# Patient Record
Sex: Male | Born: 1985 | Hispanic: Yes | Marital: Married | Smoking: Never smoker
Health system: Southern US, Community
[De-identification: ages and names within clinical notes are randomized; demographics above are authoritative.]

## PROBLEM LIST (undated history)

## (undated) ENCOUNTER — Emergency Department: Admission: EM | Payer: Self-pay | Source: Home / Self Care

## (undated) DIAGNOSIS — F329 Major depressive disorder, single episode, unspecified: Secondary | ICD-10-CM

## (undated) DIAGNOSIS — I10 Essential (primary) hypertension: Secondary | ICD-10-CM

## (undated) DIAGNOSIS — F419 Anxiety disorder, unspecified: Secondary | ICD-10-CM

## (undated) DIAGNOSIS — F32A Depression, unspecified: Secondary | ICD-10-CM

---

## 2016-10-26 ENCOUNTER — Emergency Department
Admission: EM | Admit: 2016-10-26 | Discharge: 2016-10-26 | Disposition: A | Discharge status: Home / Self Care | Payer: Self-pay | Attending: Emergency Medicine | Admitting: Emergency Medicine

## 2016-10-26 ENCOUNTER — Encounter: Payer: Self-pay | Admitting: Emergency Medicine

## 2016-10-26 DIAGNOSIS — R45851 Suicidal ideations: Secondary | ICD-10-CM

## 2016-10-26 DIAGNOSIS — Z5181 Encounter for therapeutic drug level monitoring: Secondary | ICD-10-CM | POA: Insufficient documentation

## 2016-10-26 DIAGNOSIS — F32A Depression, unspecified: Secondary | ICD-10-CM

## 2016-10-26 DIAGNOSIS — I10 Essential (primary) hypertension: Secondary | ICD-10-CM | POA: Insufficient documentation

## 2016-10-26 DIAGNOSIS — F4325 Adjustment disorder with mixed disturbance of emotions and conduct: Secondary | ICD-10-CM

## 2016-10-26 DIAGNOSIS — F329 Major depressive disorder, single episode, unspecified: Secondary | ICD-10-CM | POA: Insufficient documentation

## 2016-10-26 HISTORY — DX: Major depressive disorder, single episode, unspecified: F32.9

## 2016-10-26 HISTORY — DX: Depression, unspecified: F32.A

## 2016-10-26 HISTORY — DX: Essential (primary) hypertension: I10

## 2016-10-26 LAB — COMPREHENSIVE METABOLIC PANEL
ALT: 60 U/L (ref 17–63)
AST: 36 U/L (ref 15–41)
Albumin: 4.7 g/dL (ref 3.5–5.0)
Alkaline Phosphatase: 69 U/L (ref 38–126)
Anion gap: 9 (ref 5–15)
BUN: 17 mg/dL (ref 6–20)
CHLORIDE: 101 mmol/L (ref 101–111)
CO2: 29 mmol/L (ref 22–32)
Calcium: 9.3 mg/dL (ref 8.9–10.3)
Creatinine, Ser: 1.15 mg/dL (ref 0.61–1.24)
Glucose, Bld: 106 mg/dL — ABNORMAL HIGH (ref 65–99)
POTASSIUM: 3.8 mmol/L (ref 3.5–5.1)
Sodium: 139 mmol/L (ref 135–145)
Total Bilirubin: 0.6 mg/dL (ref 0.3–1.2)
Total Protein: 8.1 g/dL (ref 6.5–8.1)

## 2016-10-26 LAB — URINE DRUG SCREEN, QUALITATIVE (ARMC ONLY)
AMPHETAMINES, UR SCREEN: NOT DETECTED
Barbiturates, Ur Screen: NOT DETECTED
Benzodiazepine, Ur Scrn: POSITIVE — AB
CANNABINOID 50 NG, UR ~~LOC~~: NOT DETECTED
Cocaine Metabolite,Ur ~~LOC~~: NOT DETECTED
MDMA (ECSTASY) UR SCREEN: NOT DETECTED
METHADONE SCREEN, URINE: NOT DETECTED
Opiate, Ur Screen: NOT DETECTED
Phencyclidine (PCP) Ur S: NOT DETECTED
TRICYCLIC, UR SCREEN: NOT DETECTED

## 2016-10-26 LAB — CBC
HCT: 45.3 % (ref 40.0–52.0)
HEMOGLOBIN: 15.4 g/dL (ref 13.0–18.0)
MCH: 28 pg (ref 26.0–34.0)
MCHC: 33.9 g/dL (ref 32.0–36.0)
MCV: 82.5 fL (ref 80.0–100.0)
PLATELETS: 266 10*3/uL (ref 150–440)
RBC: 5.49 MIL/uL (ref 4.40–5.90)
RDW: 13.2 % (ref 11.5–14.5)
WBC: 9.5 10*3/uL (ref 3.8–10.6)

## 2016-10-26 LAB — ACETAMINOPHEN LEVEL

## 2016-10-26 LAB — ETHANOL

## 2016-10-26 LAB — SALICYLATE LEVEL

## 2016-10-26 MED ORDER — BUPRENORPHINE HCL 8 MG SL SUBL
8.0000 mg | SUBLINGUAL_TABLET | Freq: Every day | SUBLINGUAL | Status: DC
  Administered 2016-10-26: 8 mg via SUBLINGUAL
  Filled 2016-10-26: qty 1

## 2016-10-26 NOTE — ED Notes (Signed)
Patient taking shower.

## 2016-10-26 NOTE — ED Notes (Signed)
Breakfast tray brought to patient

## 2016-10-26 NOTE — ED Triage Notes (Addendum)
Pt ambulatory to triage with steady gait. Pt brought in by sister. Pt sts he has HX of depression, currently taking medication. Recent events, unspecified by pt, has pt having depression and SI thoughts without plan. Pt denies ETOH and drug usage. Pt is cooperative and calm in triage with understanding of process.

## 2016-10-26 NOTE — ED Provider Notes (Signed)
[facility] Emergency Department Provider Note   First MD Initiated Contact with Patient 10/26/16 0133     (approximate)  I have reviewed the triage vital signs and the nursing notes.   HISTORY  Chief Complaint Suicidal   HPI Denny PeonJairo Manalo is a 31 y.o. male with history of depression and previous suicide attempt via "cutting"presents emergency Department with feelings of depression and suicidal ideation 4 days. Patient states that he left his family 3 days ago   Past Medical History:  Diagnosis Date  . Depression   . Hypertension     There are no active problems to display for this patient.   History reviewed. No pertinent surgical history.  Prior to Admission medications   Medication Sig Start Date End Date Taking? Authorizing Provider  buprenorphine (SUBUTEX) 8 MG SUBL SL tablet Place 8 mg under the tongue daily.   Yes Historical Provider, MD    Allergies Patient has no known allergies.  History reviewed. No pertinent family history.  Social History Social History  Substance Use Topics  . Smoking status: Never Smoker  . Smokeless tobacco: Never Used  . Alcohol use Yes    Review of Systems Constitutional: No fever/chills Eyes: No visual changes. ENT: No sore throat. Cardiovascular: Denies chest pain. Respiratory: Denies shortness of breath. Gastrointestinal: No abdominal pain.  No nausea, no vomiting.  No diarrhea.  No constipation. Genitourinary: Negative for dysuria. Musculoskeletal: Negative for back pain. Skin: Negative for rash. Neurological: Negative for headaches, focal weakness or numbness. Psychiatric:Positive for depression and suicidal ideation.  10-point ROS otherwise negative.  ____________________________________________   PHYSICAL EXAM:  VITAL SIGNS: ED Triage Vitals  Enc Vitals Group     BP 10/26/16 0102 (!) 172/106     Pulse Rate 10/26/16 0102 (!) 106     Resp --      Temp 10/26/16 0102 98.2 F  (36.8 C)     Temp Source 10/26/16 0102 Oral     SpO2 10/26/16 0102 99 %     Weight 10/26/16 0102 240 lb (108.9 kg)     Height 10/26/16 0102 5\' 9"  (1.753 m)     Head Circumference --      Peak Flow --      Pain Score 10/26/16 0132 0     Pain Loc --      Pain Edu? --      Excl. in GC? --     Constitutional: Alert and oriented. Well appearing and in no acute distress. Eyes: Conjunctivae are normal. PERRL. EOMI. Head: Atraumatic. Mouth/Throat: Mucous membranes are moist Neck: No stridor.  Cardiovascular: Normal rate, regular rhythm. Good peripheral circulation. Grossly normal heart sounds. Respiratory: Normal respiratory effort.  No retractions. Lungs CTAB. Gastrointestinal: Soft and nontender. No distention.  Musculoskeletal: No lower extremity tenderness nor edema. No gross deformities of extremities. Neurologic:  Normal speech and language. No gross focal neurologic deficits are appreciated.  Skin:  Skin is warm, dry and intact. No rash noted. Psychiatric: Depressed mood.Marland Kitchen. Speech and behavior are normal.  ____________________________________________   LABS (all labs ordered are listed, but only abnormal results are displayed)  Labs Reviewed  COMPREHENSIVE METABOLIC PANEL - Abnormal; Notable for the following:       Result Value   Glucose, Bld 106 (*)    All other components within normal limits  ACETAMINOPHEN LEVEL - Abnormal; Notable for the following:    Acetaminophen (Tylenol), Serum <10 (*)    All other components within normal limits  URINE DRUG SCREEN, QUALITATIVE ([facility] ONLY) - Abnormal; Notable for the following:    Benzodiazepine, Ur Scrn POSITIVE (*)    All other components within normal limits  ETHANOL  SALICYLATE LEVEL  CBC      Procedures     INITIAL IMPRESSION / ASSESSMENT AND PLAN / ED COURSE  Pertinent labs & imaging results that were available during my care of the patient were reviewed by me and considered in my medical decision making (see  chart for details).  Patient was suicidal ideation and depression awaiting psychiatry evaluation.      ____________________________________________  FINAL CLINICAL IMPRESSION(S) / ED DIAGNOSES  Final diagnoses:  Suicidal ideation  Depression, unspecified depression type     MEDICATIONS GIVEN DURING THIS VISIT:  Medications - No data to display   NEW OUTPATIENT MEDICATIONS STARTED DURING THIS VISIT:  New Prescriptions   No medications on file    Modified Medications   No medications on file    Discontinued Medications   No medications on file     Note:  This document was prepared using Dragon voice recognition software and may include unintentional dictation errors.    Darci Current, MD 10/26/16 [phone]

## 2016-10-26 NOTE — Consult Note (Signed)
[facility] Consult   Reason for Consult:  Consult for this 31 year old man who came into the emergency room with recent increase in anxiety and depression Referring Physician:  Quale Patient Identification: Kurt Collier MRN:  [id number] Principal Diagnosis: Adjustment disorder with mixed disturbance of emotions and conduct Diagnosis:   Patient Active Problem List   Diagnosis Date Noted  . Adjustment disorder with mixed disturbance of emotions and conduct [F43.25] 10/26/2016    Total Time spent with patient: 1 hour  Subjective:   Kurt Collier is a 31 y.o. male patient admitted with "my depression was bad" patient interviewed chart reviewed. 31 year old man who reports that he's been under a lot of stress at work. He works at [employer] and says that he's been feeling overwhelmed by stress and working long hours. He feels like a few days ago he had a sort of breakdown. He disappeared for 3 days when off driving in his car hiding from his family keeping his phone turned off. Finally checked his phone saw that they had been searching for him and came back to his family. He is a little vague about his symptoms but tells me that he feels depressed and stressed out. Sleep patterns are erratic. Denies having any suicidal or homicidal thoughts at all. Denies any psychotic symptoms. He says that he has an alcohol problem and had been drinking some recently but back on it. He says he has an opiate problem and used to take Subutex but has not used any drugs recently. Not currently on any psychiatric medicine.  Social history: Lives with his wife and 3 children. Works as a Freight forwarder at [employer]. Her Graff medical history: No identified medical problems outside of the mental health.  Substance abuse history: He says he has a chronic alcohol and opiate problem. Used to be on Subutex. Has not gotten back into abusing opiates recently. No history of DTs or seizures.Marland Kitchen  HPI:  One previous  hospitalization at [facility] because of suicidal ideation and an overdose some years ago. Has been treated with medicine in the past including antidepressants but is vague about whether they ever helped. He thought they wishes made him feel tired. No history of violence no history of psychosis. Unclear diagnosis  Past Psychiatric History: See note directly above under history of present illness. One previous hospitalization in one previous suicide attempt not currently receiving any treatment  Risk to Self: Suicidal Ideation: Yes-Currently Present Suicidal Intent: No Is patient at risk for suicide?: No Suicidal Plan?: No Access to Means: Yes Specify Access to Suicidal Means: Pt has access to drugs What has been your use of drugs/alcohol within the last 12 months?: beer, 3 16 oz, once a week How many times?: 0 Other Self Harm Risks: none identified Triggers for Past Attempts: None known Intentional Self Injurious Behavior: None Risk to Others: Homicidal Ideation: No Thoughts of Harm to Others: No Current Homicidal Intent: No Current Homicidal Plan: No Access to Homicidal Means: No Identified Victim: None identified History of harm to others?: No Assessment of Violence: None Noted Violent Behavior Description: None identified Does patient have access to weapons?: No Criminal Charges Pending?: No Does patient have a court date: No Prior Inpatient Therapy: Prior Inpatient Therapy: Yes Prior Therapy Dates: 2011 Prior Therapy Facilty/Provider(s): [facility] Reason for Treatment: depression Prior Outpatient Therapy: Prior Outpatient Therapy: No Prior Therapy Dates: n/a Prior Therapy Facilty/Provider(s): n/a Reason for Treatment: n/a Does patient have an ACCT team?: No Does patient have  Intensive In-House Services?  : No Does patient have [facility] services? : No Does patient have P4CC services?: No  Past Medical History:  Past Medical History:  Diagnosis Date  . Depression   .  Hypertension    History reviewed. No pertinent surgical history. Family History: History reviewed. No pertinent family history. Family Psychiatric  History: Positive for a family history of depression and one uncle who had schizophrenia Social History:  History  Alcohol Use  . Yes     History  Drug use: Unknown    Social History   Social History  . Marital status: Married    Spouse name: N/A  . Number of children: N/A  . Years of education: N/A   Social History Main Topics  . Smoking status: Never Smoker  . Smokeless tobacco: Never Used  . Alcohol use Yes  . Drug use: Unknown  . Sexual activity: Not Asked   Other Topics Concern  . None   Social History Narrative  . None   Additional Social History:    Allergies:  No Known Allergies  Labs:  Results for orders placed or performed during the hospital encounter of 10/26/16 (from the past 48 hour(s))  Comprehensive metabolic panel     Status: Abnormal   Collection Time: 10/26/16  1:10 AM  Result Value Ref Range   Sodium 139 135 - 145 mmol/L   Potassium 3.8 3.5 - 5.1 mmol/L   Chloride 101 101 - 111 mmol/L   CO2 29 22 - 32 mmol/L   Glucose, Bld 106 (H) 65 - 99 mg/dL   BUN 17 6 - 20 mg/dL   Creatinine, Ser 1.15 0.61 - 1.24 mg/dL   Calcium 9.3 8.9 - 10.3 mg/dL   Total Protein 8.1 6.5 - 8.1 g/dL   Albumin 4.7 3.5 - 5.0 g/dL   AST 36 15 - 41 U/L   ALT 60 17 - 63 U/L   Alkaline Phosphatase 69 38 - 126 U/L   Total Bilirubin 0.6 0.3 - 1.2 mg/dL   GFR calc non Af Amer >60 >60 mL/min   GFR calc Af Amer >60 >60 mL/min    Comment: (NOTE) The eGFR has been calculated using the CKD EPI equation. This calculation has not been validated in all clinical situations. eGFR's persistently <60 mL/min signify possible Chronic Kidney Disease.    Anion gap 9 5 - 15  Ethanol     Status: None   Collection Time: 10/26/16  1:10 AM  Result Value Ref Range   Alcohol, Ethyl (B) <5 <5 mg/dL    Comment:        LOWEST DETECTABLE LIMIT  FOR SERUM ALCOHOL IS 5 mg/dL FOR MEDICAL PURPOSES ONLY   Salicylate level     Status: None   Collection Time: 10/26/16  1:10 AM  Result Value Ref Range   Salicylate Lvl <2.5 2.8 - 30.0 mg/dL  Acetaminophen level     Status: Abnormal   Collection Time: 10/26/16  1:10 AM  Result Value Ref Range   Acetaminophen (Tylenol), Serum <10 (L) 10 - 30 ug/mL    Comment:        THERAPEUTIC CONCENTRATIONS VARY SIGNIFICANTLY. A RANGE OF 10-30 ug/mL MAY BE AN EFFECTIVE CONCENTRATION FOR MANY PATIENTS. HOWEVER, SOME ARE BEST TREATED AT CONCENTRATIONS OUTSIDE THIS RANGE. ACETAMINOPHEN CONCENTRATIONS >150 ug/mL AT 4 HOURS AFTER INGESTION AND >50 ug/mL AT 12 HOURS AFTER INGESTION ARE OFTEN ASSOCIATED WITH TOXIC REACTIONS.   cbc     Status: None  Collection Time: 10/26/16  1:10 AM  Result Value Ref Range   WBC 9.5 3.8 - 10.6 K/uL   RBC 5.49 4.40 - 5.90 MIL/uL   Hemoglobin 15.4 13.0 - 18.0 g/dL   HCT 45.3 40.0 - 52.0 %   MCV 82.5 80.0 - 100.0 fL   MCH 28.0 26.0 - 34.0 pg   MCHC 33.9 32.0 - 36.0 g/dL   RDW 13.2 11.5 - 14.5 %   Platelets 266 150 - 440 K/uL  Urine Drug Screen, Qualitative     Status: Abnormal   Collection Time: 10/26/16  1:10 AM  Result Value Ref Range   Tricyclic, Ur Screen NONE DETECTED NONE DETECTED   Amphetamines, Ur Screen NONE DETECTED NONE DETECTED   MDMA (Ecstasy)Ur Screen NONE DETECTED NONE DETECTED   Cocaine Metabolite,Ur [place] NONE DETECTED NONE DETECTED   Opiate, Ur Screen NONE DETECTED NONE DETECTED   Phencyclidine (PCP) Ur S NONE DETECTED NONE DETECTED   Cannabinoid 50 Ng, Ur [place] NONE DETECTED NONE DETECTED   Barbiturates, Ur Screen NONE DETECTED NONE DETECTED   Benzodiazepine, Ur Scrn POSITIVE (A) NONE DETECTED   Methadone Scn, Ur NONE DETECTED NONE DETECTED    Comment: (NOTE) 174  Tricyclics, urine               Cutoff 1000 ng/mL 200  Amphetamines, urine             Cutoff 1000 ng/mL 300  MDMA (Ecstasy), urine           Cutoff 500 ng/mL 400  Cocaine  Metabolite, urine       Cutoff 300 ng/mL 500  Opiate, urine                   Cutoff 300 ng/mL 600  Phencyclidine (PCP), urine      Cutoff 25 ng/mL 700  Cannabinoid, urine              Cutoff 50 ng/mL 800  Barbiturates, urine             Cutoff 200 ng/mL 900  Benzodiazepine, urine           Cutoff 200 ng/mL 1000 Methadone, urine                Cutoff 300 ng/mL 1100 1200 The urine drug screen provides only a preliminary, unconfirmed 1300 analytical test result and should not be used for non-medical 1400 purposes. Clinical consideration and professional judgment should 1500 be applied to any positive drug screen result due to possible 1600 interfering substances. A more specific alternate chemical method 1700 must be used in order to obtain a confirmed analytical result.  1800 Gas chromato graphy / mass spectrometry (GC/MS) is the preferred 1900 confirmatory method.     Current Facility-Administered Medications  Medication Dose Route Frequency Provider Last Rate Last Dose  . buprenorphine (SUBUTEX) sublingual tablet 8 mg  8 mg Sublingual Daily Orbie Pyo, MD   8 mg at 10/26/16 1338   Current Outpatient Prescriptions  Medication Sig Dispense Refill  . buprenorphine (SUBUTEX) 8 MG SUBL SL tablet Place 8 mg under the tongue daily.      Musculoskeletal: Strength & Muscle Tone: within normal limits Gait & Station: normal Patient leans: N/A  Psychiatric Specialty Exam: Physical Exam  Nursing note and vitals reviewed. Constitutional: He appears well-developed and well-nourished.  HENT:  Head: Normocephalic and atraumatic.  Eyes: Conjunctivae are normal. Pupils are equal, round, and reactive to light.  Neck: Normal range  of motion.  Cardiovascular: Regular rhythm and normal heart sounds.   Respiratory: He is in respiratory distress.  GI: Soft.  Musculoskeletal: Normal range of motion.  Neurological: He is alert.  Skin: Skin is warm and dry.  Psychiatric: He has a  normal mood and affect. His speech is normal and behavior is normal. Judgment and thought content normal. He is not agitated. Cognition and memory are normal.    ROS  Blood pressure (!) 148/92, pulse 67, temperature 98.1 F (36.7 C), temperature source Oral, resp. rate 18, height _0  (1.753 m), weight 108.9 kg (240 lb), SpO2 100 %.Body mass index is 35.44 kg/m.  General Appearance: Casual  Eye Contact:  Good  Speech:  Clear and Coherent  Volume:  Normal  Mood:  Euthymic  Affect:  Constricted  Thought Process:  Goal Directed  Orientation:  Full (Time, Place, and Person)  Thought Content:  Logical  Suicidal Thoughts:  No  Homicidal Thoughts:  No  Memory:  Immediate;   Good Recent;   Fair Remote;   Fair  Judgement:  Fair  Insight:  Fair  Psychomotor Activity:  Decreased  Concentration:  Concentration: Fair  Recall:  [employer] of Knowledge:  Fair  Language:  Fair  Akathisia:  No  Handed:  Right  AIMS (if indicated):     Assets:  Communication Skills Desire for Improvement Financial Resources/Insurance Housing Social Support  ADL's:  Intact  Cognition:  WNL  Sleep:        Treatment Plan Summary: Plan 31 year old man recently has had a bit of an anxiety breakdown. No sign of psychosis. Denies any suicidal or homicidal thoughts. Lucid and appropriate currently. Does not meet commitment criteria. Does not require further inpatient treatment. Patient counseled to follow-up with [facility] locally. No prescriptions written. Case reviewed with emergency room doctor and TTS.  Disposition: Patient does not meet criteria for psychiatric inpatient admission.  Alethia Berthold, MD 10/26/2016 4:27 PM

## 2016-10-26 NOTE — ED Notes (Signed)
PT  VOL  SEEN  BY  DR  CLAPACS  PENDING  D/C 

## 2016-10-26 NOTE — BH Assessment (Signed)
Assessment Note  Kurt Collier is an 31 y.o. male, with a history of depression, presenting to the ED, via his sister, with concerns of worsening depression and suicidal ideations with no plan or intent.  Pt reports his symptoms of depression have been gradually getting worse.  He states that ongoing conflict at work and being unfaithful has made his depression worse.  He states he felt guilty for cheating on his wife, even though she forgave him.  He states that the culmination of the infidelity and work issues caused him to leave his family.  He states that he just started driving without any specific place in mind.  He says he turned his phone off because he did not want to have contact with anyone.  Pt reports sleeping in his car in the woods.  He states that when he turned his phone on he realized he had missed several calls from his family.  Pt was reportedly missing for three days which led to his family filing a missing person's report on him.  Pt denies SI/HI while in the ED.  He denies auditory.visual hallucinations. He denies current drug use.  He does admit to drinking an occasional beer during the week.  Diagnosis: Depression  Past Medical History:  Past Medical History:  Diagnosis Date  . Depression   . Hypertension     History reviewed. No pertinent surgical history.  Family History: History reviewed. No pertinent family history.  Social History:  reports that he has never smoked. He has never used smokeless tobacco. He reports that he drinks alcohol. His drug history is not on file.  Additional Social History:  Alcohol / Drug Use Pain Medications: See PTA Prescriptions: See PTA Over the Counter: See PTA History of alcohol / drug use?: Yes Longest period of sobriety (when/how long): 4 years Negative Consequences of Use: Personal relationships, Work / School Substance #1 Name of Substance 1: Heroin 1 - Age of First Use: 26 1 - Amount (size/oz): gram 1 - Frequency:  daily 1 - Duration: varies 1 - Last Use / Amount: 2014 Substance #2 Name of Substance 2: beer 2 - Age of First Use: 30 2 - Amount (size/oz): 2 16 oz bottles 2 - Frequency: once a week 2 - Duration: varies 2 - Last Use / Amount: 3 days ago  CIWA: CIWA-Ar BP: (!) 135/96 Pulse Rate: 86 COWS:    Allergies: No Known Allergies  Home Medications:  (Not in a hospital admission)  OB/GYN Status:  No LMP for male patient.  General Assessment Data Location of Assessment: [facility] ED TTS Assessment: In system Is this a Tele or Face-to-Face Assessment?: Face-to-Face Is this an Initial Assessment or a Re-assessment for this encounter?: Initial Assessment Marital status: Married [place] name: n/a Is patient pregnant?: No Pregnancy Status: No Living Arrangements: Other relatives Can pt return to current living arrangement?: Yes Admission Status: Voluntary Is patient capable of signing voluntary admission?: Yes Referral Source: Self/Family/Friend Insurance type: None     Crisis Care Plan Living Arrangements: Other relatives Legal Guardian: Other: (self) Name of Psychiatrist: none reported Name of Therapist: none reported  Education Status Is patient currently in school?: No Current Grade: n/a Highest grade of school patient has completed: 12 Name of school: n/a Contact person: n/a  Risk to self with the past 6 months Suicidal Ideation: Yes-Currently Present Has patient been a risk to self within the past 6 months prior to admission? : No Suicidal Intent: No Has patient had any suicidal  intent within the past 6 months prior to admission? : No Is patient at risk for suicide?: No Suicidal Plan?: No Has patient had any suicidal plan within the past 6 months prior to admission? : No Access to Means: Yes Specify Access to Suicidal Means: Pt has access to drugs What has been your use of drugs/alcohol within the last 12 months?: beer, 3 16 oz, once a week Previous Attempts/Gestures:  No How many times?: 0 Other Self Harm Risks: none identified Triggers for Past Attempts: None known Intentional Self Injurious Behavior: None Family Suicide History: No Recent stressful life event(s): Conflict (Comment), Turmoil (Comment) (Pt reports feeling guilty becaus ehe cheated on his wife) Persecutory voices/beliefs?: Yes Depression: Yes Depression Symptoms: Insomnia, Isolating, Guilt, Loss of interest in usual pleasures, Feeling worthless/self pity Substance abuse history and/or treatment for substance abuse?: Yes Suicide prevention information given to non-admitted patients: Not applicable  Risk to Others within the past 6 months Homicidal Ideation: No Does patient have any lifetime risk of violence toward others beyond the six months prior to admission? : No Thoughts of Harm to Others: No Current Homicidal Intent: No Current Homicidal Plan: No Access to Homicidal Means: No Identified Victim: None identified History of harm to others?: No Assessment of Violence: None Noted Violent Behavior Description: None identified Does patient have access to weapons?: No Criminal Charges Pending?: No Does patient have a court date: No Is patient on probation?: No  Psychosis Hallucinations: None noted Delusions: None noted  Mental Status Report Appearance/Hygiene: In scrubs Eye Contact: Good Motor Activity: Freedom of movement Speech: Logical/coherent Level of Consciousness: Alert Mood: Depressed, Guilty, Sad Affect: Appropriate to circumstance, Depressed, Sad Anxiety Level: Minimal Thought Processes: Coherent, Relevant Judgement: Partial Orientation: Person, Place, Time, Situation Obsessive Compulsive Thoughts/Behaviors: None  Cognitive Functioning Concentration: Good Memory: Recent Intact, Remote Intact IQ: Average Insight: Poor Impulse Control: Poor Appetite: Fair Weight Loss: 0 Weight Gain: 0 Sleep: No Change Vegetative Symptoms: None  ADLScreening [facility]  Assessment Services) Patient's cognitive ability adequate to safely complete daily activities?: Yes Patient able to express need for assistance with ADLs?: Yes Independently performs ADLs?: Yes (appropriate for developmental age)  Prior Inpatient Therapy Prior Inpatient Therapy: Yes Prior Therapy Dates: 2011 Prior Therapy Facilty/Provider(s): [facility] Reason for Treatment: depression  Prior Outpatient Therapy Prior Outpatient Therapy: No Prior Therapy Dates: n/a Prior Therapy Facilty/Provider(s): n/a Reason for Treatment: n/a Does patient have an ACCT team?: No Does patient have Intensive In-House Services?  : No Does patient have [facility] services? : No Does patient have P4CC services?: No  ADL Screening (condition at time of admission) Patient's cognitive ability adequate to safely complete daily activities?: Yes Patient able to express need for assistance with ADLs?: Yes Independently performs ADLs?: Yes (appropriate for developmental age)       Abuse/Neglect Assessment (Assessment to be complete while patient is alone) Physical Abuse: Denies Verbal Abuse: Denies Sexual Abuse: Denies Exploitation of patient/patient's resources: Denies Self-Neglect: Denies Values / Beliefs Cultural Requests During Hospitalization: None Spiritual Requests During Hospitalization: None Consults Spiritual Care Consult Needed: No Social Work Consult Needed: No Merchant navy officerAdvance Directives (For Healthcare) Does Patient Have a Medical Advance Directive?: No Would patient like information on creating a medical advance directive?: No - Patient declined    Additional Information 1:1 In Past 12 Months?: No CIRT Risk: No Elopement Risk: No Does patient have medical clearance?: Yes     Disposition:  Disposition Initial Assessment Completed for this Encounter: Yes Disposition of Patient: Other dispositions Other  disposition(s): Other (Comment) (Pending Psych MD consult)  On Site Evaluation  by:   Reviewed with Physician:    Kurt Collier Kurt Collier 10/26/2016 3:15 AM

## 2016-10-26 NOTE — Discharge Instructions (Signed)
You have been seen in the Emergency Department (ED)  today for psychiatric issues.  You have been evaluated by the behavioral medicine specialists and are being referred to: ° °[facility] °[address] °[place], [place] [address] °Phone:  [phone] or [phone] ° °Open Access:   °Walk-in ASSESSMENT hours, M-W-F, 8:00am - 3:00pm °Advanced Acess CRISIS:  M-F, 8:00am - 8:00pm °Outpatient Services Office Hours:  M-F, 8:00am - 5:00pm ° °Please return to the Emergency Department (ED)  immediately if you have ANY thoughts of hurting yourself or anyone else, so that we may help you. ° °Please avoid alcohol and drug use. ° °Follow up with your doctor and/or therapist as soon as possible regarding today's ED  visit.   Please follow up any other recommendations and clinic appointments provided by the psychiatry team that saw you in the Emergency Department. ° °

## 2016-10-26 NOTE — ED Provider Notes (Signed)
Patient seen, advised to discharge patient with outpatient follow-up by Dr. Toni Amendlapacs of psychiatry  Discharging patient home, return precautions provided, and recommendations for follow-up with [facility].   Sharyn CreamerMark Quale, MD 10/26/16 [phone]

## 2017-02-13 ENCOUNTER — Encounter: Payer: Self-pay | Admitting: Emergency Medicine

## 2017-02-13 ENCOUNTER — Emergency Department
Admission: EM | Admit: 2017-02-13 | Discharge: 2017-02-13 | Disposition: A | Discharge status: Other Acute Inpatient Hospital | Payer: Self-pay | Attending: Emergency Medicine | Admitting: Emergency Medicine

## 2017-02-13 ENCOUNTER — Inpatient Hospital Stay
Admission: AD | Admit: 2017-02-13 | Discharge: 2017-02-14 | DRG: 882 | Disposition: A | Discharge status: Home / Self Care | Payer: Self-pay | Source: Intra-hospital | Attending: Psychiatry | Admitting: Psychiatry

## 2017-02-13 DIAGNOSIS — F329 Major depressive disorder, single episode, unspecified: Secondary | ICD-10-CM | POA: Insufficient documentation

## 2017-02-13 DIAGNOSIS — F4323 Adjustment disorder with mixed anxiety and depressed mood: Principal | ICD-10-CM | POA: Diagnosis present

## 2017-02-13 DIAGNOSIS — Z630 Problems in relationship with spouse or partner: Secondary | ICD-10-CM

## 2017-02-13 DIAGNOSIS — F1111 Opioid abuse, in remission: Secondary | ICD-10-CM | POA: Diagnosis present

## 2017-02-13 DIAGNOSIS — Z915 Personal history of self-harm: Secondary | ICD-10-CM

## 2017-02-13 DIAGNOSIS — R45851 Suicidal ideations: Secondary | ICD-10-CM

## 2017-02-13 DIAGNOSIS — F32A Depression, unspecified: Secondary | ICD-10-CM

## 2017-02-13 DIAGNOSIS — F4325 Adjustment disorder with mixed disturbance of emotions and conduct: Secondary | ICD-10-CM

## 2017-02-13 DIAGNOSIS — F1121 Opioid dependence, in remission: Secondary | ICD-10-CM | POA: Diagnosis present

## 2017-02-13 HISTORY — DX: Anxiety disorder, unspecified: F41.9

## 2017-02-13 LAB — CBC
HEMATOCRIT: 44.6 % (ref 40.0–52.0)
Hemoglobin: 14.6 g/dL (ref 13.0–18.0)
MCH: 26.6 pg (ref 26.0–34.0)
MCHC: 32.7 g/dL (ref 32.0–36.0)
MCV: 81.4 fL (ref 80.0–100.0)
PLATELETS: 285 10*3/uL (ref 150–440)
RBC: 5.48 MIL/uL (ref 4.40–5.90)
RDW: 13.5 % (ref 11.5–14.5)
WBC: 8.8 10*3/uL (ref 3.8–10.6)

## 2017-02-13 LAB — COMPREHENSIVE METABOLIC PANEL
ALBUMIN: 5 g/dL (ref 3.5–5.0)
ALK PHOS: 64 U/L (ref 38–126)
ALT: 56 U/L (ref 17–63)
AST: 38 U/L (ref 15–41)
Anion gap: 7 (ref 5–15)
BILIRUBIN TOTAL: 0.9 mg/dL (ref 0.3–1.2)
BUN: 18 mg/dL (ref 6–20)
CALCIUM: 9.5 mg/dL (ref 8.9–10.3)
CO2: 27 mmol/L (ref 22–32)
CREATININE: 0.97 mg/dL (ref 0.61–1.24)
Chloride: 105 mmol/L (ref 101–111)
GFR calc Af Amer: >60 mL/min (ref >60)
GLUCOSE: 93 mg/dL (ref 65–99)
Potassium: 4.3 mmol/L (ref 3.5–5.1)
Sodium: 139 mmol/L (ref 135–145)
TOTAL PROTEIN: 8.5 g/dL — AB (ref 6.5–8.1)

## 2017-02-13 LAB — SALICYLATE LEVEL: Salicylate Lvl: <7 mg/dL (ref 2.8–30.0)

## 2017-02-13 LAB — ACETAMINOPHEN LEVEL: Acetaminophen (Tylenol), Serum: <10 ug/mL — ABNORMAL LOW (ref 10–30)

## 2017-02-13 LAB — ETHANOL

## 2017-02-13 MED ORDER — BUPRENORPHINE HCL-NALOXONE HCL 8-2 MG SL SUBL
1.0000 | SUBLINGUAL_TABLET | Freq: Every day | SUBLINGUAL | Status: DC
  Administered 2017-02-14: 1 via SUBLINGUAL
  Filled 2017-02-13: qty 1

## 2017-02-13 MED ORDER — TRAZODONE HCL 100 MG PO TABS: 100.0000 mg | ORAL_TABLET | Freq: Every evening | ORAL | Status: DC | PRN

## 2017-02-13 MED ORDER — BUPRENORPHINE HCL-NALOXONE HCL 8-2 MG SL SUBL
1.0000 | SUBLINGUAL_TABLET | Freq: Every day | SUBLINGUAL | Status: DC
  Administered 2017-02-13: 1 via SUBLINGUAL
  Filled 2017-02-13: qty 1

## 2017-02-13 MED ORDER — ALUM & MAG HYDROXIDE-SIMETH 200-200-20 MG/5ML PO SUSP: 30.0000 mL | ORAL | Status: DC | PRN

## 2017-02-13 MED ORDER — ACETAMINOPHEN 325 MG PO TABS: 650.0000 mg | ORAL_TABLET | Freq: Four times a day (QID) | ORAL | Status: DC | PRN

## 2017-02-13 MED ORDER — BUPRENORPHINE HCL 8 MG SL SUBL
SUBLINGUAL_TABLET | SUBLINGUAL | Status: AC
  Filled 2017-02-13: qty 1

## 2017-02-13 MED ORDER — HYDROXYZINE HCL 25 MG PO TABS: 25.0000 mg | ORAL_TABLET | Freq: Three times a day (TID) | ORAL | Status: DC | PRN

## 2017-02-13 MED ORDER — TRAZODONE HCL 100 MG PO TABS
100.0000 mg | ORAL_TABLET | Freq: Every evening | ORAL | Status: DC | PRN
Start: 2017-02-13 — End: 2017-02-14

## 2017-02-13 MED ORDER — MAGNESIUM HYDROXIDE 400 MG/5ML PO SUSP: 30.0000 mL | Freq: Every day | ORAL | Status: DC | PRN

## 2017-02-13 NOTE — BH Assessment (Signed)
Tele Assessment Note   Kurt Collier is an 31 y.o. male presenting to the ED with thoughts of suicide, intermittent thoughts of crashing his car. He was IVC'd by [facility] were he is receiving drug treatment services. The patient denied having a plan to this clinician but admits to ideation and intent.  Denies previous attempts or ideation in the past. The patient is separated from his wife and children, currently living with his sister.  Denies HI or A/V. Recently sober and in treatment. The patient is in a suboxone program. Denies prior inpatient treatment. Admits to family history of substance abuse on the maternal side. Presents with depressed mood, blunted affect, fair eye contact, fair judgment, poor insight, oriented x4. Remains at [facility] for outpatient treatment.   Dr. Toni Amend recommends inpatient treatment  Diagnosis: Adjustment disorder with mixed disturbance of emotions and conduct  Past Medical History:  Past Medical History:  Diagnosis Date  . Anxiety   . Depression     History reviewed. No pertinent surgical history.  Family History: History reviewed. No pertinent family history.  Social History:  reports that he has never smoked. He has never used smokeless tobacco. He reports that he drinks alcohol. His drug history is not on file.  Additional Social History:  Alcohol / Drug Use Pain Medications: see MAR Prescriptions: see MAR Over the Counter: see MAR History of alcohol / drug use?: Yes Substance #1 Name of Substance 1: Opiates 1 - Age of First Use: unknown 1 - Amount (size/oz): unknown  1 - Frequency: unknown  1 - Duration: unknown  1 - Last Use / Amount: On suboxone program, 8mg   CIWA: CIWA-Ar BP: (!) 155/99 Pulse Rate: 63 COWS:    PATIENT STRENGTHS: (choose at least two) Average or above average intelligence General fund of knowledge  Allergies: No Known Allergies  Home Medications:  (Not in a hospital admission)  OB/GYN Status:  No LMP for male  patient.  General Assessment Data Location of Assessment: [facility] ED TTS Assessment: In system Is this a Tele or Face-to-Face Assessment?: Face-to-Face Is this an Initial Assessment or a Re-assessment for this encounter?: Initial Assessment Marital status: Divorced [place] name: n/a Is patient pregnant?: No Pregnancy Status: No Living Arrangements: Other relatives (sister) Can pt return to current living arrangement?: Yes Admission Status: Involuntary Is patient capable of signing voluntary admission?: No Referral Source: Self/Family/Friend Insurance type: self pay  Medical Screening Exam [facility] Walk-in ONLY) Medical Exam completed: Yes  Crisis Care Plan Living Arrangements: Other relatives (sister) Name of Psychiatrist: [facility] Name of Therapist: [facility]  Education Status Is patient currently in school?: No Highest grade of school patient has completed: 2 yrs of college  Risk to self with the past 6 months Suicidal Ideation: Yes-Currently Present Has patient been a risk to self within the past 6 months prior to admission? : No Suicidal Intent: Yes-Currently Present Has patient had any suicidal intent within the past 6 months prior to admission? : No Is patient at risk for suicide?: Yes Suicidal Plan?: No Has patient had any suicidal plan within the past 6 months prior to admission? : No Access to Means: No What has been your use of drugs/alcohol within the last 12 months?: opiates Previous Attempts/Gestures: No How many times?: 0 Other Self Harm Risks: 0 Intentional Self Injurious Behavior: None Family Suicide History: Unknown Recent stressful life event(s): Divorce, Other (Comment) (sobriety) Persecutory voices/beliefs?: No Depression: Yes Depression Symptoms: Feeling worthless/self pity Substance abuse history and/or treatment for substance abuse?: Yes  Suicide prevention information given to non-admitted patients: Not applicable  Risk to Others within the past 6  months Homicidal Ideation: No Does patient have any lifetime risk of violence toward others beyond the six months prior to admission? : No Thoughts of Harm to Others: No Current Homicidal Intent: No Current Homicidal Plan: No Access to Homicidal Means: No History of harm to others?: No Assessment of Violence: None Noted Does patient have access to weapons?: No Criminal Charges Pending?: No Does patient have a court date: No Is patient on probation?: No  Psychosis Hallucinations: None noted Delusions: None noted  Mental Status Report Appearance/Hygiene: Unremarkable Eye Contact: Fair Motor Activity: Freedom of movement Speech: Other (Comment) (delayed) Level of Consciousness: Alert Mood: Depressed Affect: Blunted Anxiety Level: None Thought Processes: Coherent, Relevant Judgement: Impaired Orientation: Person, Place, Time, Situation Obsessive Compulsive Thoughts/Behaviors: None  Cognitive Functioning Concentration: Normal Memory: Recent Intact, Remote Intact IQ: Average Insight: Fair Impulse Control: Fair Appetite: Good Weight Loss: 0 Weight Gain: 0 Sleep: No Change Vegetative Symptoms: None  ADLScreening [facility] Assessment Services) Patient's cognitive ability adequate to safely complete daily activities?: Yes Patient able to express need for assistance with ADLs?: Yes Independently performs ADLs?: Yes (appropriate for developmental age)  Prior Inpatient Therapy Prior Inpatient Therapy: No  Prior Outpatient Therapy Prior Outpatient Therapy: Yes Prior Therapy Dates: ongoing Prior Therapy Facilty/Provider(s): [facility] Reason for Treatment: substance abuse Does patient have an ACCT team?: No Does patient have Intensive In-House Services?  : No Does patient have [facility] services? : No Does patient have P4CC services?: No  ADL Screening (condition at time of admission) Patient's cognitive ability adequate to safely complete daily activities?: Yes Is the patient deaf  or have difficulty hearing?: No Does the patient have difficulty seeing, even when wearing glasses/contacts?: No Does the patient have difficulty concentrating, remembering, or making decisions?: No Patient able to express need for assistance with ADLs?: Yes Does the patient have difficulty dressing or bathing?: No Independently performs ADLs?: Yes (appropriate for developmental age)             Merchant navy officerAdvance Directives (For Healthcare) Does Patient Have a Medical Advance Directive?: No    Additional Information 1:1 In Past 12 Months?: No CIRT Risk: No Elopement Risk: No Does patient have medical clearance?: Yes     Disposition:  Disposition Initial Assessment Completed for this Encounter: Yes Disposition of Patient: Inpatient treatment program Type of inpatient treatment program: Adult  Westley Hummershley H Kayle Correa 02/13/2017 9:31 PM

## 2017-02-13 NOTE — ED Notes (Signed)
[facility]

## 2017-02-13 NOTE — ED Provider Notes (Signed)
[facility] Emergency Department Provider Note  ____________________________________________   First MD Initiated Contact with Patient 02/13/17 1720     (approximate)  I have reviewed the triage vital signs and the nursing notes.   HISTORY  Chief Complaint Psychiatric Evaluation    HPI Kurt Collier is a 31 y.o. male with a history of anxiety and depression and prior substance abuse but he reports that today was his 3590 day clean anniversary [facility] for worsening depression and suicidal ideation.  His thoughts are occasionally that he should crash his car.  This seems to be the result of his wife recently asking for divorce.  He admits to worsening depression that waxes and wanes but is severe at times and does admit to suicidal thoughts.  Nothing in particular makes it better or worse.  He has some right lateral and posterior rib pain that started after playing basketball recently and states that he has not been active for a long time and just recently started playing again so he thinks he has a pulled muscle.  He remembers no specific trauma or injury to the area.  He denies chest pain, shortness of breath, nausea, vomiting, abdominal pain, fever/chills.   Past Medical History:  Diagnosis Date  . Anxiety   . Depression     Patient Active Problem List   Diagnosis Date Noted  . Adjustment disorder with mixed disturbance of emotions and conduct 10/26/2016    History reviewed. No pertinent surgical history.  Prior to Admission medications   Medication Sig Start Date End Date Taking? Authorizing Provider  buprenorphine (SUBUTEX) 8 MG SUBL SL tablet Place 8 mg under the tongue daily.    [provider]    Allergies Patient has no known allergies.  History reviewed. No pertinent family history.  Social History Social History  Substance Use Topics  . Smoking status: Never Smoker  . Smokeless  tobacco: Never Used  . Alcohol use Yes    Review of Systems Constitutional: No fever/chills Eyes: No visual changes. ENT: No sore throat. Cardiovascular: Denies chest pain. Respiratory: Denies shortness of breath. Gastrointestinal: No abdominal pain.  No nausea, no vomiting.  No diarrhea.  No constipation. Genitourinary: Negative for dysuria. Musculoskeletal: Negative for neck pain.  Negative for back pain.  Pain in his lateral/posterior right ribs Integumentary: Negative for rash. Neurological: Negative for headaches, focal weakness or numbness. Psych:  Worsening depression with intermittent suicidal ideation and plan to crash his car   ____________________________________________   PHYSICAL EXAM:  VITAL SIGNS: ED Triage Vitals  Enc Vitals Group     BP 02/13/17 1700 (!) 155/99     Pulse Rate 02/13/17 1700 63     Resp 02/13/17 1700 18     Temp 02/13/17 1700 98.3 F (36.8 C)     Temp Source 02/13/17 1700 Oral     SpO2 02/13/17 1700 100 %     Weight 02/13/17 1657 116.1 kg (256 lb)     Height 02/13/17 1657 1.753 m (5\' 9" )     Head Circumference --      Peak Flow --      Pain Score 02/13/17 1726 1     Pain Loc --      Pain Edu? --      Excl. in GC? --     Constitutional: Alert and oriented. Well appearing and in no acute distress. Eyes: Conjunctivae are normal.  Head: Atraumatic. Nose: No congestion/rhinnorhea. Mouth/Throat: Mucous  membranes are moist. Neck: No stridor.  No meningeal signs.   Cardiovascular: Normal rate, regular rhythm. Good peripheral circulation. Grossly normal heart sounds. Respiratory: Normal respiratory effort.  No retractions. Lungs CTAB. Gastrointestinal: Soft and nontender. No distention.  Musculoskeletal: No lower extremity tenderness nor edema. No gross deformities of extremities. I will tenderness to palpation of the right lateral posterior ribs consistent with musculoskeletal strain with no evidence of contusion and no point bony  tenderness Neurologic:  Normal speech and language. No gross focal neurologic deficits are appreciated.  Skin:  Skin is warm, dry and intact. No rash noted. Psychiatric: Mood and affect are flattened and depressed.  He admits to worsening depression and suicidal ideation.  ____________________________________________   LABS (all labs ordered are listed, but only abnormal results are displayed)  Labs Reviewed  COMPREHENSIVE METABOLIC PANEL - Abnormal; Notable for the following:       Result Value   Total Protein 8.5 (*)    All other components within normal limits  ACETAMINOPHEN LEVEL - Abnormal; Notable for the following:    Acetaminophen (Tylenol), Serum <10 (*)    All other components within normal limits  ETHANOL  SALICYLATE LEVEL  CBC  URINE DRUG SCREEN, QUALITATIVE ([facility] ONLY)   ____________________________________________  EKG  None - EKG not ordered by ED physician ____________________________________________  RADIOLOGY   No results found.  ____________________________________________   PROCEDURES  Critical Care performed: No   Procedure(s) performed:   Procedures   ____________________________________________   INITIAL IMPRESSION / ASSESSMENT AND PLAN / ED COURSE  Pertinent labs & imaging results that were available during my care of the patient were reviewed by me and considered in my medical decision making (see chart for details).  The patient has no acute medical problems at this time.  He has only very mild tenderness in the right lateral and posterior ribs that is most consistent with a musculoskeletal strain rather than a rib contusion or fracture.  No indication for radiographs at this time.  The patient was placed under involuntary commitment by [facility] and they have already spoken with Dr. Toni Amend.  Dr. Toni Amend spoke with me in person and plans to admit the patient.       ____________________________________________  FINAL CLINICAL  IMPRESSION(S) / ED DIAGNOSES  Final diagnoses:  Depression, unspecified depression type  Suicidal ideation     MEDICATIONS GIVEN DURING THIS VISIT:  Medications  buprenorphine-naloxone (SUBOXONE) 8-2 mg per SL tablet 1 tablet (not administered)  traZODone (DESYREL) tablet 100 mg (not administered)     NEW OUTPATIENT MEDICATIONS STARTED DURING THIS VISIT:  New Prescriptions   No medications on file    Modified Medications   No medications on file    Discontinued Medications   No medications on file     Note:  This document was prepared using Dragon voice recognition software and may include unintentional dictation errors.    Loleta Rose, MD 02/13/17 [phone]

## 2017-02-13 NOTE — BH Assessment (Signed)
Per Dr.Clapacs pt meets criteria for inpatient admission and [facility] pending medical [facility].

## 2017-02-13 NOTE — ED Triage Notes (Signed)
Pt also now c/o right rib pain. Ambulatory without difficulty. Reports played basketball yesterday

## 2017-02-13 NOTE — ED Notes (Signed)
PT IVC[facility]CAME FROM [facility].

## 2017-02-13 NOTE — Consult Note (Signed)
[facility] Face-to-Face Psychiatry Consult   Reason for Consult:  Consult for 31 year old man referred from [facility] because of suicidal ideation Referring Physician:  York Cerise Patient Identification: Kurt Collier MRN:  [id number] Principal Diagnosis: Adjustment disorder with mixed disturbance of emotions and conduct Diagnosis:   Patient Active Problem List   Diagnosis Date Noted  . Adjustment disorder with mixed disturbance of emotions and conduct [F43.25] 10/26/2016    Total Time spent with patient: 1 hour  Subjective:   Kurt Collier is a 31 y.o. male patient admitted with "I've just had a lot of emotions".  HPI:  Patient interviewed chart reviewed. 31 year old man who was referred from [facility] where he was talking with his therapist today. Patient has been through a lot of stress recently and is feeling overwhelmed and depressed. The biggest thing right now is that his wife has told him that he she wants to separate and get a divorce. They've been apart now for a couple weeks and he is living with his sister. Today he also graduated from the substance abuse outpatient program and so that was an extra emotional stress. He has been staying sober now for several months. Mood is feeling down and sad and a little overwhelmed. He has been having some suicidal thoughts. Sleep is okay appetite is okay. Not having any psychotic symptoms. Patient is not currently on any psychiatric medicine other than the Suboxone which is prescribed regularly by Dr. Suzie Portela  Social history: Currently living with his sister. Works full time at the [employer]. Separated from his wife. 3 children all of them now with his wife.  Medical history: No significant medical problems  Substance abuse history: History of opiate abuse. We saw him here back in February and he was discharged and referred to outpatient treatment. He has been seeing Dr. Marguerite Olea regularly and is on 8 mg of Suboxone still and is staying sober and just  completed the substance abuse outpatient program  Past Psychiatric History: No prior inpatient hospitalization. He had a suicide attempt many years ago. He is not on any psychiatric medicine currently. No history of psychosis.  Risk to Self: Is patient at risk for suicide?: Yes Risk to Others:   Prior Inpatient Therapy:   Prior Outpatient Therapy:    Past Medical History:  Past Medical History:  Diagnosis Date  . Anxiety   . Depression    History reviewed. No pertinent surgical history. Family History: History reviewed. No pertinent family history. Family Psychiatric  History: Not reporting Social History:  History  Alcohol Use  . Yes     History  Drug use: Unknown    Social History   Social History  . Marital status: Married    Spouse name: N/A  . Number of children: N/A  . Years of education: N/A   Social History Main Topics  . Smoking status: Never Smoker  . Smokeless tobacco: Never Used  . Alcohol use Yes  . Drug use: Unknown  . Sexual activity: Not Asked   Other Topics Concern  . None   Social History Narrative  . None   Additional Social History:    Allergies:  No Known Allergies  Labs:  Results for orders placed or performed during the hospital encounter of 02/13/17 (from the past 48 hour(s))  cbc     Status: None   Collection Time: 02/13/17  4:58 PM  Result Value Ref Range   WBC 8.8 3.8 - 10.6 K/uL   RBC 5.48 4.40 - 5.90  MIL/uL   Hemoglobin 14.6 13.0 - 18.0 g/dL   HCT 16.144.6 09.640.0 - 04.552.0 %   MCV 81.4 80.0 - 100.0 fL   MCH 26.6 26.0 - 34.0 pg   MCHC 32.7 32.0 - 36.0 g/dL   RDW 40.913.5 81.111.5 - 91.414.5 %   Platelets 285 150 - 440 K/uL    Current Facility-Administered Medications  Medication Dose Route Frequency Provider Last Rate Last Dose  . buprenorphine-naloxone (SUBOXONE) 8-2 mg per SL tablet 1 tablet  1 tablet Sublingual Daily Nashae Maudlin T, MD      . traZODone (DESYREL) tablet 100 mg  100 mg Oral QHS PRN Aishah Teffeteller, Jackquline DenmarkJohn T, MD       Current  Outpatient Prescriptions  Medication Sig Dispense Refill  . buprenorphine (SUBUTEX) 8 MG SUBL SL tablet Place 8 mg under the tongue daily.      Musculoskeletal: Strength & Muscle Tone: within normal limits Gait & Station: normal Patient leans: N/A  Psychiatric Specialty Exam: Physical Exam  Nursing note and vitals reviewed. Constitutional: He appears well-developed and well-nourished.  HENT:  Head: Normocephalic and atraumatic.  Eyes: Conjunctivae are normal. Pupils are equal, round, and reactive to light.  Neck: Normal range of motion.  Cardiovascular: Regular rhythm and normal heart sounds.   Respiratory: Effort normal. No respiratory distress.  GI: Soft.  Musculoskeletal: Normal range of motion.  Neurological: He is alert.  Skin: Skin is warm and dry.  Psychiatric: Judgment normal. His affect is blunt. His speech is delayed. He is slowed and withdrawn. Cognition and memory are normal. He exhibits a depressed mood. He expresses suicidal ideation.    Review of Systems  Constitutional: Negative.   HENT: Negative.   Eyes: Negative.   Respiratory: Negative.   Cardiovascular: Negative.   Gastrointestinal: Negative.   Musculoskeletal: Negative.   Skin: Negative.   Neurological: Negative.   Psychiatric/Behavioral: Positive for depression and suicidal ideas. Negative for hallucinations, memory loss and substance abuse. The patient is nervous/anxious. The patient does not have insomnia.     Blood pressure (!) 155/99, pulse 63, temperature 98.3 F (36.8 C), temperature source Oral, resp. rate 18, height 5\' 9"  (1.753 m), weight 116.1 kg (256 lb), SpO2 100 %.Body mass index is 37.8 kg/m.  General Appearance: Fairly Groomed  Eye Contact:  Fair  Speech:  Slow  Volume:  Decreased  Mood:  Dysphoric  Affect:  Blunt  Thought Process:  Goal Directed  Orientation:  Full (Time, Place, and Person)  Thought Content:  Logical  Suicidal Thoughts:  Yes.  with intent/plan  Homicidal  Thoughts:  No  Memory:  Immediate;   Good Recent;   Fair Remote;   Fair  Judgement:  Fair  Insight:  Fair  Psychomotor Activity:  Psychomotor Retardation  Concentration:  Concentration: Fair  Recall:  FiservFair  Fund of Knowledge:  Fair  Language:  Fair  Akathisia:  No  Handed:  Right  AIMS (if indicated):     Assets:  Desire for Improvement Housing Physical Health Resilience Social Support  ADL's:  Intact  Cognition:  WNL  Sleep:        Treatment Plan Summary: Medication management and Plan This is a 31 year old man with multiple recent symptoms of depression related to major stresses in his life. Currently having some suicidal thoughts. Affect looks very depressed and down. Says he has not relapsed on to drugs and continues to take his Suboxone. I spoke with the liaison from [facility] earlier today who was aware of the patient's  situation. Patient is appropriate to admit to the psychiatric ward because of suicidal thoughts. Orders completed. Full set of labs will be done. When necessary medicine for sleep and anxiety. Continue his beeper nor pain with his dose to be given this evening. Case reviewed with emergency room doctor and TTS.  Disposition: Recommend psychiatric Inpatient admission when medically cleared. Supportive therapy provided about ongoing stressors.  Mordecai Rasmussen, MD 02/13/2017 5:28 PM

## 2017-02-13 NOTE — ED Notes (Signed)
BEHAVIORAL HEALTH ROUNDING Patient sleeping: No. Patient alert and oriented: yes Behavior appropriate: Yes.  ; If no, describe:  Nutrition and fluids offered: yes Toileting and hygiene offered: Yes  Sitter present: q15 minute observations and security  monitoring Law enforcement present: Yes  ODS  

## 2017-02-13 NOTE — ED Triage Notes (Signed)
Sent from [facility] with [facility] as IVC.  Pt has been having intermittent thoughts of wanting to kill himself by crashing car. Per [facility] papers his wife recently asked for a divorce.  Pt denies SI at this time but admits to having these thoughts. Denies HI and hallucinations.

## 2017-02-13 NOTE — ED Notes (Signed)

## 2017-02-14 ENCOUNTER — Encounter: Payer: Self-pay | Admitting: Psychiatry

## 2017-02-14 DIAGNOSIS — F1111 Opioid abuse, in remission: Secondary | ICD-10-CM | POA: Diagnosis present

## 2017-02-14 DIAGNOSIS — F4323 Adjustment disorder with mixed anxiety and depressed mood: Principal | ICD-10-CM

## 2017-02-14 LAB — LIPID PANEL
Cholesterol: 155 mg/dL (ref 0–200)
HDL: 48 mg/dL (ref >40)
LDL Cholesterol: 82 mg/dL (ref 0–99)
TRIGLYCERIDES: 127 mg/dL (ref <150)
Total CHOL/HDL Ratio: 3.2 RATIO
VLDL: 25 mg/dL (ref 0–40)

## 2017-02-14 LAB — TSH: TSH: 0.8 u[IU]/mL (ref 0.350–4.500)

## 2017-02-14 NOTE — H&P (Signed)
Psychiatric Admission Assessment Adult  Patient Identification: Kurt Collier MRN:  [id number] Date of Evaluation:  02/14/2017 Chief Complaint:  major depressive disorder Principal Diagnosis: Adjustment disorder with mixed anxiety and depressed mood Diagnosis:   Patient Active Problem List   Diagnosis Date Noted  . Opioid use disorder, mild, in early remission, on maintenance therapy [F11.11] 02/14/2017  . Adjustment disorder with mixed anxiety and depressed mood [F43.23] 02/13/2017   History of Present Illness:   Identifying data. Kurt Collier is a 31 year old male  with a history of opiate dependence in remission.  Chief complaint. "I've just had a lot of emotions in talk about them for the first time yesterday."  History of present illness. Information was obtained from the patient and the chart. The patient was then directed to the emergency room from [facility]. While talking to his substance abuse counselor, the patient disclosed information about his marital difficulties and passing suicidal ideation without plan. He was rather emotional. He explains that he has not been talking and evaluate about his problems and yesterday became overwhelmed. 3 weeks ago his wife of 17 years asked to separate. The patient moved in with his parents but has been seeing his wife and his daughters regularly. The wife did not tell him why she wants a divorce and has been constantly asking him for money. Yesterday the patient had presented emotional but also successful with a graduating from [facility] program that he has been attending diligently not mixing given one class. He continues in Suboxone clinic and has been receiving 8 mg of Suboxone daily from Dr. Suzie Portela. The patient considers himself healthy person who lives life but yesterday did feel overwhelmed. He no longer feels suicidal or homicidal. He denies symptoms of depression, anxiety, psychosis, or symptoms suggestive of bipolar mania. He denies alcohol or  illicit substance use. He has been compliant with Suboxone.  Past psychiatric history. 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 He has been sober on Suboxone for several months. He has never been hospitalized. There is one suicide attempt many years ago.  Family psychiatric history. Nonreported.  Social history. 3 weeks ago he separated from his wife. He lives with his sister. He regularly sees his 3 daughters ages 82, 37 and 71. He works at [employer].   Total Time spent with patient: 1 hour  Is the patient at risk to self? Yes.    Has the patient been a risk to self in the past 6 months? No.  Has the patient been a risk to self within the distant past? Yes.    Is the patient a risk to others? No.  Has the patient been a risk to others in the past 6 months? No.  Has the patient been a risk to others within the distant past? No.   Prior Inpatient Therapy:   Prior Outpatient Therapy:    Alcohol Screening: 1. How often do you have a drink containing alcohol?: Monthly or less 2. How many drinks containing alcohol do you have on a typical day when you are drinking?: 1 or 2 3. How often do you have six or more drinks on one occasion?: Never Preliminary Score: 0 4. How often during the last year have you found that you were not able to stop drinking once you had started?: Never 5. How often during the last year have you failed to do what was normally expected from you becasue of drinking?: Never 6. How  often during the last year have you needed a first drink in the morning to get yourself going after a heavy drinking session?: Never 7. How often during the last year have you had a feeling of guilt of remorse after drinking?: Never 8. How often during the last year have you been unable to remember what happened the night before because you had been drinking?: Never 9. Have you or someone else been injured as a result of your drinking?: No 10. Has a  relative or friend or a doctor or another health worker been concerned about your drinking or suggested you cut down?: No Alcohol Use Disorder Identification Test Final Score (AUDIT): 1 Brief Intervention: AUDIT score less than 7 or less-screening does not suggest unhealthy drinking-brief intervention not indicated Substance Abuse History in the last 12 months:  Yes.   Consequences of Substance Abuse: Negative Previous Psychotropic Medications: Yes  Psychological Evaluations: No  Past Medical History:  Past Medical History:  Diagnosis Date  . Anxiety   . Depression    History reviewed. No pertinent surgical history. Family History: History reviewed. No pertinent family history.  Tobacco Screening: Have you used any form of tobacco in the last 30 days? (Cigarettes, Smokeless Tobacco, Cigars, and/or Pipes): No Social History:  History  Alcohol Use  . 0.6 oz/week  . 1 Glasses of wine per week     History  Drug Use No    Additional Social History:                           Allergies:  No Known Allergies Lab Results:  Results for orders placed or performed during the hospital encounter of 02/13/17 (from the past 48 hour(s))  Lipid panel     Status: None   Collection Time: 02/14/17  7:10 AM  Result Value Ref Range   Cholesterol 155 0 - 200 mg/dL   Triglycerides 161 <096 mg/dL   HDL 48 >04 mg/dL   Total CHOL/HDL Ratio 3.2 RATIO   VLDL 25 0 - 40 mg/dL   LDL Cholesterol 82 0 - 99 mg/dL    Comment:        Total Cholesterol/HDL:CHD Risk Coronary Heart Disease Risk Table                     Men   Women  1/2 Average Risk   3.4   3.3  Average Risk       5.0   4.4  2 X Average Risk   9.6   7.1  3 X Average Risk  23.4   11.0        Use the calculated Patient Ratio above and the CHD Risk Table to determine the patient's CHD Risk.        ATP III CLASSIFICATION (LDL):  <100     mg/dL   Optimal  540-981  mg/dL   Near or Above                    Optimal  130-159  mg/dL    Borderline  191-478  mg/dL   High  >295     mg/dL   Very High   TSH     Status: None   Collection Time: 02/14/17  7:10 AM  Result Value Ref Range   TSH 0.800 0.350 - 4.500 uIU/mL    Comment: Performed by a 3rd Generation assay with a functional sensitivity of <=0.01 uIU/mL.  Blood Alcohol level:  Lab Results  Component Value Date   ETH <5 02/13/2017   ETH <5 10/26/2016    Metabolic Disorder Labs:  No results found for: HGBA1C, MPG No results found for: PROLACTIN Lab Results  Component Value Date   CHOL 155 02/14/2017   TRIG 127 02/14/2017   HDL 48 02/14/2017   CHOLHDL 3.2 02/14/2017   VLDL 25 02/14/2017   LDLCALC 82 02/14/2017    Current Medications: Current Facility-Administered Medications  Medication Dose Route Frequency Provider Last Rate Last Dose  . acetaminophen (TYLENOL) tablet 650 mg  650 mg Oral Q6H PRN Clapacs, John T, MD      . alum & mag hydroxide-simeth (MAALOX/MYLANTA) 200-200-20 MG/5ML suspension 30 mL  30 mL Oral Q4H PRN Clapacs, John T, MD      . buprenorphine-naloxone (SUBOXONE) 8-2 mg per SL tablet 1 tablet  1 tablet Sublingual Daily Clapacs, Jackquline Denmark, MD   1 tablet at 02/14/17 9604  . hydrOXYzine (ATARAX/VISTARIL) tablet 25 mg  25 mg Oral TID PRN Clapacs, John T, MD      . magnesium hydroxide (MILK OF MAGNESIA) suspension 30 mL  30 mL Oral Daily PRN Clapacs, John T, MD      . traZODone (DESYREL) tablet 100 mg  100 mg Oral QHS PRN Clapacs, Jackquline Denmark, MD       PTA Medications: Prescriptions Prior to Admission  Medication Sig Dispense Refill Last Dose  . buprenorphine (SUBUTEX) 8 MG SUBL SL tablet Place 8 mg under the tongue daily.   02/13/2017 at Unknown time    Musculoskeletal: Strength & Muscle Tone: within normal limits Gait & Station: normal Patient leans: N/A  Psychiatric Specialty Exam: I reviewed physical examination performed in the ER and agree with the findings. Physical Exam  Nursing note and vitals reviewed. Psychiatric: He has a  normal mood and affect. His speech is normal and behavior is normal. Thought content normal. Cognition and memory are normal. He expresses impulsivity.    Review of Systems  Psychiatric/Behavioral: Positive for substance abuse.  All other systems reviewed and are negative.   Blood pressure 124/84, pulse (!) 56, temperature 98.4 F (36.9 C), temperature source Oral, resp. rate 19, height 5\' 9"  (1.753 m), weight 113.4 kg (250 lb), SpO2 100 %.Body mass index is 36.92 kg/m.  See SRA.                                                  Sleep:  Number of Hours: 7.5    Treatment Plan Summary: Daily contact with patient to assess and evaluate symptoms and progress in treatment and Medication management   Kurt Collier is a 31 year old male with a history of opioid dependence on Suboxone admitted for suicidal ideation in the context of marital problems.  1. Suicidal ideation. Resolved. The patient adamantly denies any thoughts, intentions, or plans to hurt himself or others. He is able to contract for safety. He is forward thinking and optimistic about the future. He is a loving father.  2. Mood. The patient is not interested in pharmacotherapy for depression.  3. Opioid dependence. We will continue Suboxone 8 mg as prescribed by Dr. Bard Herbert.  4. Disposition. He will be discharged to home with his sister. He will follow up in with [facility].   Observation Level/Precautions:  15 minute checks  Laboratory:  CBC Chemistry Profile UDS UA  Psychotherapy:    Medications:    Consultations:    Discharge Concerns:    Estimated LOS:  Other:     Physician Treatment Plan for Primary Diagnosis: Adjustment disorder with mixed anxiety and depressed mood Long Term Goal(s): Improvement in symptoms so as ready for discharge  Short Term Goals: Ability to identify changes in lifestyle to reduce recurrence of condition will improve, Ability to verbalize feelings will improve, Ability to  disclose and discuss suicidal ideas, Ability to demonstrate self-control will improve, Ability to identify and develop effective coping behaviors will improve and Ability to identify triggers associated with substance abuse/mental health issues will improve  Physician Treatment Plan for Secondary Diagnosis: Principal Problem:   Adjustment disorder with mixed anxiety and depressed mood Active Problems:   Opioid use disorder, mild, in early remission, on maintenance therapy  Long Term Goal(s): Improvement in symptoms so as ready for discharge  Short Term Goals: Ability to identify changes in lifestyle to reduce recurrence of condition will improve, Ability to demonstrate self-control will improve and Ability to identify triggers associated with substance abuse/mental health issues will improve  I certify that inpatient services furnished can reasonably be expected to improve the patient's condition.    Kristine LineaJolanta Chimere Klingensmith, MD 5/31/201810:59 AM

## 2017-02-14 NOTE — [facility] Group Notes (Signed)
[facility] LCSW Group Therapy Note  Type of Therapy and Topic:  Group Therapy:  Goals Group: SMART Goals  Participation Level:  Patient attended group on this date. Patient participated in goal setting and was able to share openly with the group.   Description of Group:   The purpose of a daily goals group is to assist and guide patients in setting recovery/wellness-related goals.  The objective is to set goals as they relate to the crisis in which they were admitted. Patients will be using SMART goal modalities to set measurable goals.  Characteristics of realistic goals will be discussed and patients will be assisted in setting and processing how one will reach their goal. Facilitator will also assist patients in applying interventions and coping skills learned in psycho-education groups to the SMART goal and process how one will achieve defined goal.  Therapeutic Goals: -Patients will develop and document one goal related to or their crisis in which brought them into treatment. -Patients will be guided by LCSW using SMART goal setting modality in how to set a measurable, attainable, realistic and time sensitive goal.  -Patients will process barriers in reaching goal. -Patients will process interventions in how to overcome and successful in reaching goal.   Summary of Patient Progress:  Patient Goal: Patient stated goal of learning 2 new coping skills to manage stress by discharge.    Therapeutic Modalities:   Motivational Interviewing Engineer, manufacturing systemsCognitive Behavioral Therapy Crisis Intervention Model SMART goals setting  Yedidya Duddy G. Garnette CzechSampson MSW, [facility]SWA 02/14/2017 10:21 AM

## 2017-02-14 NOTE — [facility] Counselor (Signed)
Patient is discharged within 24 hours - psychosocial assessment is not required at this time. CSW will continue with discharge planning.   Kurt AbbotKadijah Herminia Collier, MSW, LCSW-A 02/14/2017, 10:52 AM

## 2017-02-14 NOTE — [facility] Suicide Risk Assessment (Signed)
[facility] INPATIENT:  Family/Significant Other Suicide Prevention Education  Suicide Prevention Education:  Education Completed; sister, Kurt Collier ph#: [phone] has been identified by the patient as the family member/significant other with whom the patient will be residing, and identified as the person(s) who will aid the patient in the event of a mental health crisis (suicidal ideations/suicide attempt).  With written consent from the patient, the family member/significant other has been provided the following suicide prevention education, prior to the and/or following the discharge of the patient.  The suicide prevention education provided includes the following:  Suicide risk factors  Suicide prevention and interventions  National Suicide Hotline telephone number  [facility] assessment telephone number  [facility] Assistance 911  [facility] and/or Residential [facility] Crisis [facility] telephone number  Request made of family/significant other to:  Remove weapons (e.g., guns, rifles, knives), all items previously/currently identified as safety concern.    Remove drugs/medications (over-the-counter, prescriptions, illicit drugs), all items previously/currently identified as a safety concern.  The family member/significant other verbalizes understanding of the suicide prevention education information provided.  The family member/significant other agrees to remove the items of safety concern listed above.  Kurt Collier, MSW, LCSW-A 02/14/2017, 11:10 AM

## 2017-02-14 NOTE — [facility] Suicide Risk Assessment (Signed)
Kurt Collier   Nursing information obtained from:  Patient Demographic factors:  Male, Low socioeconomic status Current Mental Status:  NA Loss Factors:  Loss of significant relationship Historical Factors:  NA Risk Reduction Factors:  Responsible for children under 33 years of age, Sense of responsibility to family, Employed  Total Time spent with patient: 1 hour Principal Problem: Adjustment disorder with mixed anxiety and depressed mood Diagnosis:   Patient Active Problem List   Diagnosis Date Noted  . Opioid use disorder, mild, in early remission, on maintenance therapy [F11.11] 02/14/2017  . Adjustment disorder with mixed anxiety and depressed mood [F43.23] 02/13/2017   Subjective Data: suicidal ideation.  Continued Clinical Symptoms:  Alcohol Use Disorder Identification Test Final Score (AUDIT): 1 The "Alcohol Use Disorders Identification Test", Guidelines for Use in Primary Care, Second Edition.  World Science writerHealth [employer] [facility](WHO). Score between 0-7:  no or low risk or alcohol related problems. Score between 8-15:  moderate risk of alcohol related problems. Score between 16-19:  high risk of alcohol related problems. Score 20 or above:  warrants further diagnostic evaluation for alcohol dependence and treatment.   CLINICAL FACTORS:   Depression:   Comorbid alcohol abuse/dependence Impulsivity Alcohol/Substance Abuse/Dependencies   Musculoskeletal: Strength & Muscle Tone: within normal limits Gait & Station: normal Patient leans: N/A  Psychiatric Specialty Exam: Physical Exam  Nursing note and vitals reviewed. Psychiatric: He has a normal mood and affect. His speech is normal and behavior is normal. Thought content normal. Cognition and memory are normal. He expresses impulsivity.    Review of Systems  Psychiatric/Behavioral: Positive for substance abuse.  All other systems reviewed and are negative.   Blood pressure 124/84, pulse (!) 56,  temperature 98.4 F (36.9 C), temperature source Oral, resp. rate 19, height 5\' 9"  (1.753 m), weight 113.4 kg (250 lb), SpO2 100 %.Body mass index is 36.92 kg/m.  General Appearance: Casual  Eye Contact:  Good  Speech:  Clear and Coherent  Volume:  Normal  Mood:  Euthymic  Affect:  Appropriate  Thought Process:  Goal Directed and Descriptions of Associations: Intact  Orientation:  Full (Time, Place, and Person)  Thought Content:  WDL  Suicidal Thoughts:  No  Homicidal Thoughts:  No  Memory:  Immediate;   Fair Recent;   Fair Remote;   Fair  Judgement:  Impaired  Insight:  Present  Psychomotor Activity:  Normal  Concentration:  Concentration: Fair and Attention Span: Fair  Recall:  [employer]Fair  Fund of Knowledge:  Fair  Language:  Fair  Akathisia:  No  Handed:  Right  AIMS (if indicated):     Assets:  Communication Skills Desire for Improvement Financial Resources/Insurance Housing Physical Health Resilience Social Support Transportation Vocational/Educational  ADL's:  Intact  Cognition:  WNL  Sleep:  Number of Hours: 7.5      COGNITIVE FEATURES THAT CONTRIBUTE TO RISK:  None    SUICIDE RISK:   Moderate:  Frequent suicidal ideation with limited intensity, and duration, some specificity in terms of plans, no associated intent, good self-control, limited dysphoria/symptomatology, some risk factors present, and identifiable protective factors, including available and accessible social support.  PLAN OF CARE: Hospital admission, medication management, substance abuse counseling, discharge planning.  Kurt Collier is a 31 year old male with a history of opioid dependence on Suboxone admitted for suicidal ideation in the context of marital problems.  1. Suicidal ideation. Resolved. The patient adamantly denies any thoughts, intentions, or plans to hurt himself or others. He is  able to contract for safety. He is forward thinking and optimistic about the future. He is a loving  father.  2. Mood. The patient is not interested in pharmacotherapy for depression.  3. Opioid dependence. We will continue Suboxone 8 mg as prescribed by Dr. Bard Herbert.  4. Disposition. He will be discharged to home with his sister. He will follow up in with [facility].  I certify that inpatient services furnished can reasonably be expected to improve the patient's condition.   Kristine Linea, MD 02/14/2017, 10:59 AM

## 2017-02-14 NOTE — Progress Notes (Signed)
Patient discharged home. DC instructions provided and explained. Medications reviewed. Rx given. All questions answered. Denies SI, HI, AVH. Belongings returned. AVS, transition and risk assessment given to patient. Pt stable at discharge.

## 2017-02-14 NOTE — Discharge Summary (Signed)
Physician Discharge Summary Note  Patient:  Kurt Collier is an 30 y.o., male MRN:  [id number] DOB:  April 12, 1986 Patient phone:  [phone] (home)  Patient address:   [address] [place] [address],  Total Time spent with patient: 1 hour  Date of Admission:  02/13/2017 Date of Discharge: 02/14/2017  Reason for Admission:  Suicidal ideation.  Identifying data. Kurt Collier a 31 year old male with a history of opiate dependence in remission.  Chief complaint. "I've just had a lot of emotions in talk about them for the first time yesterday."  History of present illness. Information was obtained from the patient and the chart. The patient was then directed to the emergency room from [facility]. While talking to his substance abuse counselor, the patient disclosed information about his marital difficulties and passing suicidal ideation without plan. He was rather emotional. He explains that he has not been talking and evaluate about his problems and yesterday became overwhelmed. 3 weeks ago his wife of 17 years asked to separate. The patient moved in with his parents but has been seeing his wife and his daughters regularly. The wife did not tell him why she wants a divorce and has been constantly asking him for money. Yesterday the patient had presented emotional but also successful with a graduating from [facility] that he has been attending diligently not mixing given one class. He continues in Suboxone clinic and has been receiving 8 mg of Suboxone daily from Dr. Suzie Portela. The patient considers himself healthy person who lives life but yesterday did feel overwhelmed. He no longer feels suicidal or homicidal. He denies symptoms of depression, anxiety, psychosis, or symptoms suggestive of bipolar mania. He denies alcohol or illicit substance use. He has been compliant with Suboxone.  Past psychiatric history. 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 He has been sober on Suboxone for several months. He has never been hospitalized. There is one suicide attempt many years ago.  Family psychiatric history. Nonreported.  Social history. 3 weeks ago he separated from his wife. He lives with his sister. He regularly sees his 3 daughters ages 16, 61 and 31. He works at [employer].   Principal Problem: Adjustment disorder with mixed anxiety and depressed mood Discharge Diagnoses: Patient Active Problem List   Diagnosis Date Noted  . Opioid use disorder, mild, in early remission, on maintenance therapy [F11.11] 02/14/2017  . Adjustment disorder with mixed anxiety and depressed mood [F43.23] 02/13/2017   Past Medical History:  Past Medical History:  Diagnosis Date  . Anxiety   . Depression    History reviewed. No pertinent surgical history. Family History: History reviewed. No pertinent family history.  Social History:  History  Alcohol Use  . 0.6 oz/week  . 1 Glasses of wine per week     History  Drug Use No    Social History   Social History  . Marital status: Married    Spouse name: N/A  . Number of children: N/A  . Years of education: N/A   Social History Main Topics  . Smoking status: Never Smoker  . Smokeless tobacco: Never Used  . Alcohol use 0.6 oz/week    1 Glasses of wine per week  . Drug use: No  . Sexual activity: Yes    Birth control/ protection: None   Other Topics Concern  . None   Social History Narrative  . None    Hospital Course:  Kurt Collier is a 31 year old male with a history of opioid dependence on Suboxone admitted for suicidal ideation in the context of marital problems.  1. Suicidal ideation. Resolved. The patient adamantly denies any thoughts, intentions, or plans to hurt himself or others. He is able to contract for safety. He is forward thinking and optimistic about the future. He is a loving father.  2. Mood. The patient is not interested in pharmacotherapy for  depression.  3. Opioid dependence. We will continue Suboxone 8 mg as prescribed by Dr. Bard Herbert.  4. Disposition. He will be discharged to home with his sister. He will follow up in with [facility].  Physical Findings: AIMS: Facial and Oral Movements Muscles of Facial Expression: None, normal Lips and Perioral Area: None, normal Jaw: None, normal Tongue: None, normal,Extremity Movements Upper (arms, wrists, hands, fingers): None, normal Lower (legs, knees, ankles, toes): None, normal, Trunk Movements Neck, shoulders, hips: None, normal, Overall Severity Severity of abnormal movements (highest score from questions above): None, normal Incapacitation due to abnormal movements: None, normal Patient's awareness of abnormal movements (rate only patient's report): No Awareness, Dental Status Current problems with teeth and/or dentures?: No Does patient usually wear dentures?: No  CIWA:  CIWA-Ar Total: 4 COWS:  COWS Total Score: 0  Musculoskeletal: Strength & Muscle Tone: within normal limits Gait & Station: normal Patient leans: N/A  Psychiatric Specialty Exam: Physical Exam  Nursing note and vitals reviewed. Psychiatric: He has a normal mood and affect. His speech is normal and behavior is normal. Thought content normal. Cognition and memory are normal. He expresses impulsivity.    Review of Systems  Psychiatric/Behavioral: Positive for substance abuse.  All other systems reviewed and are negative.   Blood pressure 124/84, pulse (!) 56, temperature 98.4 F (36.9 C), temperature source Oral, resp. rate 19, height 5\' 9"  (1.753 m), weight 113.4 kg (250 lb), SpO2 100 %.Body mass index is 36.92 kg/m.  General Appearance: Casual  Eye Contact:  Good  Speech:  Clear and Coherent  Volume:  Normal  Mood:  Euthymic  Affect:  Appropriate  Thought Process:  Goal Directed and Descriptions of Associations: Intact  Orientation:  Full (Time, Place, and Person)  Thought Content:  WDL  Suicidal  Thoughts:  No  Homicidal Thoughts:  No  Memory:  Immediate;   Fair Recent;   Fair Remote;   Fair  Judgement:  Fair  Insight:  Fair  Psychomotor Activity:  Normal  Concentration:  Concentration: Fair and Attention Span: Fair  Recall:  [employer] of Knowledge:  Fair  Language:  Fair  Akathisia:  No  Handed:  Right  AIMS (if indicated):     Assets:  Communication Skills Desire for Improvement Financial Resources/Insurance Housing Physical Health Resilience Social Support Talents/Skills Transportation Vocational/Educational  ADL's:  Intact  Cognition:  WNL  Sleep:  Number of Hours: 7.5     Have you used any form of tobacco in the last 30 days? (Cigarettes, Smokeless Tobacco, Cigars, and/or Pipes): No  Has this patient used any form of tobacco in the last 30 days? (Cigarettes, Smokeless Tobacco, Cigars, and/or Pipes) Yes, No  Blood Alcohol level:  Lab Results  Component Value Date   ETH <5 02/13/2017   ETH <5 10/26/2016    Metabolic Disorder Labs:  No results found for: HGBA1C, MPG No results found for: PROLACTIN Lab Results  Component Value Date   CHOL 155 02/14/2017   TRIG 127 02/14/2017   HDL 48 02/14/2017   CHOLHDL 3.2  02/14/2017   VLDL 25 02/14/2017   LDLCALC 82 02/14/2017    See Psychiatric Specialty Exam and Suicide Risk Assessment completed by Attending Physician prior to discharge.  Discharge destination:  Home  Is patient on multiple antipsychotic therapies at discharge:  No   Has Patient had three or more failed trials of antipsychotic monotherapy by history:  No  Recommended Plan for Multiple Antipsychotic Therapies: NA  Discharge Instructions    Diet - low sodium heart healthy    Complete by:  As directed    Increase activity slowly    Complete by:  As directed      Allergies as of 02/14/2017   No Known Allergies     Medication List    TAKE these medications     Indication  buprenorphine 8 MG Subl SL tablet Commonly known as:   SUBUTEX Place 8 mg under the tongue daily.  Indication:  Opioid Dependence      Follow-up Information    [facility] Go on 02/15/2017.   Why:  [name] and with Dr. Carman ChingMoffet on June 1st at 1:00PM. If you have questions or concerns, contact [facility] directly. Contact information: [address] [place] [address] [phone]           Follow-up recommendations:  Activity:  As tolerated. Diet:  Low sodium heart healthy. Other:  Keep follow-up appointments.  Comments:    Signed: Kristine LineaJolanta Kierria Feigenbaum, MD 02/14/2017, 11:12 AM

## 2017-02-14 NOTE — Tx Team (Signed)
Initial Treatment Plan 02/14/2017 2:04 AM Kurt Collier [id number]    PATIENT STRESSORS: Loss of Relationship.  Marital or family conflict Substance abuse   PATIENT STRENGTHS: Ability for insight Capable of independent living General fund of knowledge Religious Affiliation   PATIENT IDENTIFIED PROBLEMS: Suicidal ideation.   Depression                    DISCHARGE CRITERIA:  Improved stabilization in mood, thinking, and/or behavior Motivation to continue treatment in a less acute level of care  PRELIMINARY DISCHARGE PLAN: Outpatient therapy  PATIENT/FAMILY INVOLVEMENT: This treatment plan has been presented to and reviewed with the patient, Kurt Collier, The patient and family have been given the opportunity to ask questions and make suggestions.  Trula Orelubukola Abisola Tamantha Saline, RN 02/14/2017, 2:04 AM

## 2017-02-14 NOTE — Progress Notes (Signed)
Admission Note:  [age] yr male who presents IVC in no acute distress for the treatment of SI and Depression. Pt appears flat and depressed. Pt was calm and cooperative with admission process. Pt denies SI and contracts for safety upon admission. Pt denies AVH. Pt experienced SI because wife asked for a divorce. Pt has Past medical Hx of Anxiety, Depression and Substance Abuse. Skin was assessed and found to be clear of any abnormal marks apart from some scattered tattoos on the body , patient was searched and no contraband found. POC and unit policies explained and understanding verbalized. Food and fluids offered, and fluids accepted. Pt had no additional questions or concerns, 15 minutes checks maintained will continue to monitor.

## 2017-02-14 NOTE — Progress Notes (Signed)
  [facility] :  Will you be returning to the same living situation after discharge:  Yes,  return with his sister At discharge, do you have transportation home?: Yes,  sister will pick patient up. Do you have the ability to pay for your medications: Yes,  medication management clinic.  Release of information consent forms completed and in the chart;  Patient's signature needed at discharge.  Patient to Follow up at: Follow-up Information    [facility] Go on 02/15/2017.   Why:  [facility] and with Dr. Carman ChingMoffet on June 1st at 1:00PM. If you have questions or concerns, contact [facility] directly. Contact information: [address] [place] [address] [phone]           Next level of care provider has access to [facility] Link:no  Safety Planning and Suicide Prevention discussed: Yes,  SPE completed with patient and pt's sister.  Have you used any form of tobacco in the last 30 days? (Cigarettes, Smokeless Tobacco, Cigars, and/or Pipes): No  Has patient been referred to the Quitline?: N/A patient is not a smoker  Patient has been referred for addiction treatment: Yes  [name], MSW, LCSW-A 02/14/2017, 10:58 AM

## 2017-02-14 NOTE — [facility] Suicide Risk Assessment (Signed)
[facility]   Principal Problem: Adjustment disorder with mixed anxiety and depressed mood Discharge Diagnoses:  Patient Active Problem List   Diagnosis Date Noted  . Opioid use disorder, mild, in early remission, on maintenance therapy [F11.11] 02/14/2017  . Adjustment disorder with mixed anxiety and depressed mood [F43.23] 02/13/2017    Total Time spent with patient: 1 hour  Musculoskeletal: Strength & Muscle Tone: within normal limits Gait & Station: normal Patient leans: N/A  Psychiatric Specialty Exam: Review of Systems  Psychiatric/Behavioral: Positive for substance abuse.  All other systems reviewed and are negative.   Blood pressure 124/84, pulse (!) 56, temperature 98.4 F (36.9 C), temperature source Oral, resp. rate 19, height 5\' 9"  (1.753 m), weight 113.4 kg (250 lb), SpO2 100 %.Body mass index is 36.92 kg/m.  General Appearance: Casual  Eye Contact::  Good  Speech:  Clear and Coherent409  Volume:  Normal  Mood:  Euthymic  Affect:  Appropriate  Thought Process:  Goal Directed and Descriptions of Associations: Intact  Orientation:  Full (Time, Place, and Person)  Thought Content:  WDL  Suicidal Thoughts:  No  Homicidal Thoughts:  No  Memory:  Immediate;   Fair Recent;   Fair Remote;   Fair  Judgement:  Fair  Insight:  Fair  Psychomotor Activity:  Normal  Concentration:  Fair  Recall:  [employer]Fair  Fund of Knowledge:Fair  Language: Fair  Akathisia:  No  Handed:  Right  AIMS (if indicated):     Assets:  Communication Skills Desire for Improvement Financial Resources/Insurance Housing Physical Health Resilience Social Support Talents/Skills Transportation Vocational/Educational  Sleep:  Number of Hours: 7.5  Cognition: WNL  ADL's:  Intact   Mental Status Per Nursing Assessment::   On Admission:  NA  Demographic Factors:  Male and Divorced or widowed  Loss Factors: Loss of significant relationship  Historical  Factors: Prior suicide attempts and Impulsivity  Risk Reduction Factors:   Responsible for children under 33 years of age, Sense of responsibility to family, Employed, Living with another person, especially a relative, Positive social support and Positive therapeutic relationship  Continued Clinical Symptoms:  Severe Anxiety and/or Agitation Alcohol/Substance Abuse/Dependencies  Cognitive Features That Contribute To Risk:  None    Suicide Risk:  Minimal: No identifiable suicidal ideation.  Patients presenting with no risk factors but with morbid ruminations; may be classified as minimal risk based on the severity of the depressive symptoms  Follow-up Information    [facility] Go on 02/15/2017.   Why:  Please [name] AM and with Dr. Carman ChingMoffet on June 1st at 1:00PM. If you have questions or concerns, contact [facility] directly. Contact information: [address] [place] [address] [phone]           Plan Of Care/Follow-up recommendations:  Activity:  As tolerated. Diet:  Low sodium heart healthy. Other:  Keep follow-up appointments.  Kristine LineaJolanta Marlean Mortell, MD 02/14/2017, 11:09 AM

## 2017-02-15 LAB — HEMOGLOBIN A1C
Hgb A1c MFr Bld: 5.4 % (ref 4.8–5.6)
Mean Plasma Glucose: 108 mg/dL

## 2018-07-16 ENCOUNTER — Other Ambulatory Visit: Payer: Self-pay

## 2018-07-16 ENCOUNTER — Encounter: Payer: Self-pay | Admitting: Emergency Medicine

## 2018-07-16 DIAGNOSIS — Z9114 Patient's other noncompliance with medication regimen: Secondary | ICD-10-CM | POA: Diagnosis not present

## 2018-07-16 DIAGNOSIS — I10 Essential (primary) hypertension: Secondary | ICD-10-CM | POA: Diagnosis not present

## 2018-07-16 DIAGNOSIS — Z79899 Other long term (current) drug therapy: Secondary | ICD-10-CM | POA: Diagnosis not present

## 2018-07-16 DIAGNOSIS — R51 Headache: Secondary | ICD-10-CM | POA: Diagnosis present

## 2018-07-16 DIAGNOSIS — F419 Anxiety disorder, unspecified: Secondary | ICD-10-CM | POA: Diagnosis not present

## 2018-07-16 NOTE — ED Triage Notes (Signed)
Pt here for headache on and off for 1 week. No pain at this time. Has had lot stress. Using motrin OTC for it. Also reports has had trouble getting BP under control.  Denies vision changes. Unlabored. Just took his BP med while walking in door, reports takes at this time every night; amlodipine.  Denies CP/SHOB.  Pt c/o anxiety about his BP.

## 2018-07-17 ENCOUNTER — Emergency Department
Admission: EM | Admit: 2018-07-17 | Discharge: 2018-07-17 | Disposition: A | Discharge status: Home / Self Care | Payer: BLUE CROSS/BLUE SHIELD | Attending: Emergency Medicine | Admitting: Emergency Medicine

## 2018-07-17 DIAGNOSIS — I10 Essential (primary) hypertension: Secondary | ICD-10-CM

## 2018-07-17 NOTE — ED Notes (Signed)
Pt called X 3 loudly. Had to wake from sleep in lobby.  C/o of headache now that is across whole head.

## 2018-07-17 NOTE — ED Provider Notes (Signed)
[facility] Emergency Department Provider Note   First MD Initiated Contact with Patient 07/17/18 [phone]     (approximate)  I have reviewed the triage vital signs and the nursing notes.   HISTORY  Chief Complaint Headache    HPI Kurt Collier is a 32 y.o. male with history of hypertension anxiety depression presents to the emergency department with 1 week history of intermittent headaches that the patient describes as bitemporal with current pain score described as mild.  Patient denies any weakness numbness gait instability or visual changes patient denies any nausea vomiting.  Patient also admits to difficulty controlling his blood pressure.  Patient has been prescribed amlodipine 10 mg tablets which he states that he has not been taking for a bit secondary to concerns of possible side effects.  Patient admits also to feelings of anxiety related to his blood pressure.  Patient states that he took his amlodipine tablet on his way into the emergency department.  On arrival patient's blood pressure 173/103.  Patient denies any chest pain no shortness of breath.     Past Medical History:  Diagnosis Date  . Anxiety   . Depression   . Hypertension     Patient Active Problem List   Diagnosis Date Noted  . Opioid use disorder, mild, in early remission, on maintenance therapy (HCC) 02/14/2017  . Adjustment disorder with mixed anxiety and depressed mood 02/13/2017    History reviewed. No pertinent surgical history.  Prior to Admission medications   Medication Sig Start Date End Date Taking? Authorizing Provider  buprenorphine (SUBUTEX) 8 MG SUBL SL tablet Place 8 mg under the tongue daily.    [provider]    Allergies Patient has no known allergies.  History reviewed. No pertinent family history.  Social History Social History   Tobacco Use  . Smoking status: Never Smoker  . Smokeless tobacco: Never Used  Substance Use Topics  . Alcohol  use: Yes    Alcohol/week: 1.0 standard drinks    Types: 1 Glasses of wine per week  . Drug use: No    Review of Systems Constitutional: No fever/chills Eyes: No visual changes. ENT: No sore throat. Cardiovascular: Denies chest pain. Respiratory: Denies shortness of breath. Gastrointestinal: No abdominal pain.  No nausea, no vomiting.  No diarrhea.  No constipation. Genitourinary: Negative for dysuria. Musculoskeletal: Negative for neck pain.  Negative for back pain. Integumentary: Negative for rash. Neurological: Negative for headaches, focal weakness or numbness. { ____________________________________________   PHYSICAL EXAM:  VITAL SIGNS: ED Triage Vitals [07/16/18 2337]  Enc Vitals Group     BP (!) 173/103     Pulse Rate 81     Resp 16     Temp 98.4 F (36.9 C)     Temp Source Oral     SpO2 98 %     Weight 106.1 kg (234 lb)     Height 1.753 m (5\' 9" )     Head Circumference      Peak Flow      Pain Score 0     Pain Loc      Pain Edu?      Excl. in GC?     Constitutional: Alert and oriented. Well appearing and in no acute distress. Eyes: Conjunctivae are normal. PERRL. EOMI. Head: Atraumatic. Mouth/Throat: Mucous membranes are moist.  Oropharynx non-erythematous. Neck: No stridor.  No meningeal signs.   Cardiovascular: Normal rate, regular rhythm. Good peripheral circulation. Grossly normal heart sounds. Respiratory:  Normal respiratory effort.  No retractions. Lungs CTAB. Gastrointestinal: Soft and nontender. No distention.  Musculoskeletal: No lower extremity tenderness nor edema. No gross deformities of extremities. Neurologic:  Normal speech and language. No gross focal neurologic deficits are appreciated.  Skin:  Skin is warm, dry and intact. No rash noted. Psychiatric: She does affect.  Speech and behavior are normal.  ____________________________________________     :   Procedures   ____________________________________________   INITIAL  IMPRESSION / ASSESSMENT AND PLAN / ED COURSE  As part of my medical decision making, I reviewed the following data within the electronic MEDICAL RECORD NUMBER  32 year old male presenting with above-stated history and physical exam secondary to above-stated complaints.  Regarding the patient's hypertension blood pressure at present 134/87.  Suspect patient's hypertension to be secondary to medication noncompliance as the patient stated.  Patient with no focal neurological deficits or any clinical findings concerning for intracranial pathology and as suchimaging not performed.  _____________________________________  FINAL CLINICAL IMPRESSION(S) / ED DIAGNOSES  Final diagnoses:  Essential hypertension     MEDICATIONS GIVEN DURING THIS VISIT:  Medications - No data to display   ED Discharge Orders    None       Note:  This document was prepared using Dragon voice recognition software and may include unintentional dictation errors.    Darci Current, MD 07/17/18 2226

## 2018-08-01 ENCOUNTER — Other Ambulatory Visit: Payer: Self-pay

## 2018-08-01 ENCOUNTER — Encounter: Payer: Self-pay | Admitting: Emergency Medicine

## 2018-08-01 DIAGNOSIS — F419 Anxiety disorder, unspecified: Secondary | ICD-10-CM | POA: Insufficient documentation

## 2018-08-01 DIAGNOSIS — I10 Essential (primary) hypertension: Secondary | ICD-10-CM | POA: Diagnosis not present

## 2018-08-01 DIAGNOSIS — Z79899 Other long term (current) drug therapy: Secondary | ICD-10-CM | POA: Diagnosis not present

## 2018-08-01 DIAGNOSIS — H538 Other visual disturbances: Secondary | ICD-10-CM | POA: Insufficient documentation

## 2018-08-01 DIAGNOSIS — R002 Palpitations: Secondary | ICD-10-CM | POA: Diagnosis not present

## 2018-08-01 NOTE — ED Triage Notes (Signed)
Pt reports around 22:30 took new BP medication Nifedipine 90mg  and Buspirone 10mg  for anxiety, reports started to feel his heart racing, blurred vision, pt talks in complete sentences denies any other symptom.

## 2018-08-02 ENCOUNTER — Emergency Department
Admission: EM | Admit: 2018-08-02 | Discharge: 2018-08-02 | Disposition: A | Discharge status: Home / Self Care | Payer: BLUE CROSS/BLUE SHIELD | Attending: Emergency Medicine | Admitting: Emergency Medicine

## 2018-08-02 ENCOUNTER — Emergency Department: Payer: BLUE CROSS/BLUE SHIELD

## 2018-08-02 DIAGNOSIS — R002 Palpitations: Secondary | ICD-10-CM

## 2018-08-02 DIAGNOSIS — F419 Anxiety disorder, unspecified: Secondary | ICD-10-CM

## 2018-08-02 LAB — CBC
HEMATOCRIT: 44.8 % (ref 39.0–52.0)
Hemoglobin: 14.8 g/dL (ref 13.0–17.0)
MCH: 26.8 pg (ref 26.0–34.0)
MCHC: 33 g/dL (ref 30.0–36.0)
MCV: 81 fL (ref 80.0–100.0)
NRBC: 0 % (ref 0.0–0.2)
PLATELETS: 304 10*3/uL (ref 150–400)
RBC: 5.53 MIL/uL (ref 4.22–5.81)
RDW: 12.3 % (ref 11.5–15.5)
WBC: 9.3 10*3/uL (ref 4.0–10.5)

## 2018-08-02 LAB — BASIC METABOLIC PANEL
ANION GAP: 9 (ref 5–15)
BUN: 15 mg/dL (ref 6–20)
CHLORIDE: 103 mmol/L (ref 98–111)
CO2: 26 mmol/L (ref 22–32)
CREATININE: 0.9 mg/dL (ref 0.61–1.24)
Calcium: 9.6 mg/dL (ref 8.9–10.3)
GFR calc non Af Amer: >60 mL/min (ref >60)
Glucose, Bld: 121 mg/dL — ABNORMAL HIGH (ref 70–99)
POTASSIUM: 3.3 mmol/L — AB (ref 3.5–5.1)
SODIUM: 138 mmol/L (ref 135–145)

## 2018-08-02 LAB — LIPASE, BLOOD: LIPASE: 30 U/L (ref 11–51)

## 2018-08-02 LAB — ETHANOL: Alcohol, Ethyl (B): <10 mg/dL (ref <10)

## 2018-08-02 LAB — HEPATIC FUNCTION PANEL
ALBUMIN: 5 g/dL (ref 3.5–5.0)
ALK PHOS: 71 U/L (ref 38–126)
ALT: 57 U/L — ABNORMAL HIGH (ref 0–44)
AST: 35 U/L (ref 15–41)
Bilirubin, Direct: 0.1 mg/dL (ref 0.0–0.2)
Indirect Bilirubin: 0.4 mg/dL (ref 0.3–0.9)
TOTAL PROTEIN: 8.1 g/dL (ref 6.5–8.1)
Total Bilirubin: 0.5 mg/dL (ref 0.3–1.2)

## 2018-08-02 LAB — TSH: TSH: 2.634 u[IU]/mL (ref 0.350–4.500)

## 2018-08-02 LAB — URINE DRUG SCREEN, QUALITATIVE (ARMC ONLY)
AMPHETAMINES, UR SCREEN: NOT DETECTED
Barbiturates, Ur Screen: NOT DETECTED
Benzodiazepine, Ur Scrn: NOT DETECTED
Cannabinoid 50 Ng, Ur ~~LOC~~: NOT DETECTED
Cocaine Metabolite,Ur ~~LOC~~: NOT DETECTED
MDMA (Ecstasy)Ur Screen: NOT DETECTED
Methadone Scn, Ur: NOT DETECTED
Opiate, Ur Screen: NOT DETECTED
Phencyclidine (PCP) Ur S: NOT DETECTED
TRICYCLIC, UR SCREEN: NOT DETECTED

## 2018-08-02 LAB — TROPONIN I: Troponin I: <0.03 ng/mL (ref <0.03)

## 2018-08-02 LAB — T4, FREE: FREE T4: 0.98 ng/dL (ref 0.82–1.77)

## 2018-08-02 MED ORDER — LORAZEPAM 1 MG PO TABS
1.0000 mg | ORAL_TABLET | Freq: Once | ORAL | Status: AC
  Administered 2018-08-02: 1 mg via ORAL
  Filled 2018-08-02: qty 1

## 2018-08-02 NOTE — ED Notes (Signed)
Reviewed discharge instructions, follow-up care, and medication with patient. Patient verbalized understanding of all information reviewed. Patient stable, with no distress noted at this time.    

## 2018-08-02 NOTE — ED Notes (Signed)
Pt up to restroom.

## 2018-08-02 NOTE — ED Provider Notes (Signed)
[facility] Emergency Department Provider Note   ____________________________________________   First MD Initiated Contact with Patient 08/02/18 0030     (approximate)  I have reviewed the triage vital signs and the nursing notes.   HISTORY  Chief Complaint Tachycardia; Anxiety; and Hypertension    HPI Kurt Collier is a 32 y.o. male resents to the ED from [facility] with a chief complaint of palpitations, anxiety and blurry vision.  Patient has a history of polysubstance abuse but has been clean for the past 6 months.  Has been working out, eating well and taking care of himself.  Has been working with his PCP to control his blood pressure.  On metoprolol, patient felt palpitations and dizziness.  He tolerated 5 mg amlodipine well but it did not control his blood pressure.  On 10 mg amlodipine patient felt similar symptoms of palpitations and dizziness.  At 1030pm tonight he took new medicines including nifedipine 90 mg and buspirone 10 mg.  Subsequently he went to [facility] to buy eggs and felt palpitations and flushed.  Denies recent fever, chills, chest pain, shortness of breath, abdominal pain, nausea or vomiting.  Currently feeling slightly anxious but overall better.  Denies excessive caffeine use.   Past Medical History:  Diagnosis Date  . Anxiety   . Depression   . Hypertension     Patient Active Problem List   Diagnosis Date Noted  . Opioid use disorder, mild, in early remission, on maintenance therapy (HCC) 02/14/2017  . Adjustment disorder with mixed anxiety and depressed mood 02/13/2017    History reviewed. No pertinent surgical history.  Prior to Admission medications   Medication Sig Start Date End Date Taking? Authorizing Provider  Buprenorphine HCl-Naloxone HCl 8-2 MG FILM Place 1 Film under the tongue daily. 07/25/18   [provider]  busPIRone (BUSPAR) 10 MG tablet Take 10 mg by mouth 2 (two) times daily. 07/30/18   [provider]  FLUoxetine (PROZAC) 40 MG capsule Take 40 mg by mouth daily. 07/25/18   [provider]  hydrOXYzine (ATARAX/VISTARIL) 50 MG tablet Take 50 mg by mouth at bedtime. 07/01/18   [provider]  NIFEdipine (ADALAT CC) 90 MG 24 hr tablet Take 90 mg by mouth daily. 07/30/18   [provider]    Allergies Patient has no known allergies.  No family history on file.  Social History Social History   Tobacco Use  . Smoking status: Never Smoker  . Smokeless tobacco: Never Used  Substance Use Topics  . Alcohol use: Yes    Alcohol/week: 1.0 standard drinks    Types: 1 Glasses of wine per week  . Drug use: No    Review of Systems  Constitutional: No fever/chills Eyes: No visual changes. ENT: No sore throat. Cardiovascular: Positive for palpitations. Denies chest pain. Respiratory: Denies shortness of breath. Gastrointestinal: No abdominal pain.  No nausea, no vomiting.  No diarrhea.  No constipation. Genitourinary: Negative for dysuria. Musculoskeletal: Negative for back pain. Skin: Negative for rash. Neurological: Negative for headaches, focal weakness or numbness. Psychiatric: Positive for anxiety.  ____________________________________________   PHYSICAL EXAM:  VITAL SIGNS: ED Triage Vitals [08/01/18 2351]  Enc Vitals Group     BP (!) 156/83     Pulse Rate (!) 123     Resp 20     Temp 98.4 F (36.9 C)     Temp Source Oral     SpO2 99 %     Weight 234 lb (106.1  kg)     Height 5\' 9"  (1.753 m)     Head Circumference      Peak Flow      Pain Score 0     Pain Loc      Pain Edu?      Excl. in GC?     Constitutional: Alert and oriented. Well appearing and in no acute distress. Mildly anxious. Eyes: Conjunctivae are normal. PERRL. EOMI. Head: Atraumatic. Nose: No congestion/rhinnorhea. Mouth/Throat: Mucous membranes are moist.  Oropharynx non-erythematous. Neck: No stridor.  No thyromegaly. Cardiovascular: Normal rate, regular  rhythm. Grossly normal heart sounds.  Good peripheral circulation. Respiratory: Normal respiratory effort.  No retractions. Lungs CTAB. Gastrointestinal: Soft and nontender. No distention. No abdominal bruits. No CVA tenderness. Musculoskeletal: No lower extremity tenderness nor edema.  No joint effusions. Neurologic:  Normal speech and language. No gross focal neurologic deficits are appreciated. No gait instability. Skin:  Skin is warm, dry and intact. No rash noted. Psychiatric: Mood and affect are normal. Speech and behavior are normal.  ____________________________________________   LABS (all labs ordered are listed, but only abnormal results are displayed)  Labs Reviewed  BASIC METABOLIC PANEL - Abnormal; Notable for the following components:      Result Value   Potassium 3.3 (*)    Glucose, Bld 121 (*)    All other components within normal limits  HEPATIC FUNCTION PANEL - Abnormal; Notable for the following components:   ALT 57 (*)    All other components within normal limits  CBC  TROPONIN I  LIPASE, BLOOD  URINE DRUG SCREEN, QUALITATIVE ([facility] ONLY)  TSH  T4, FREE  ETHANOL   ____________________________________________  EKG  ED ECG REPORT I, Kendricks Reap J, the attending physician, personally viewed and interpreted this ECG.   Date: 08/02/2018  EKG Time: 2352  Rate: 104  Rhythm: sinus tachycardia  Axis: Normal   Intervals:none  ST&T Change: Nonspecific  ____________________________________________  RADIOLOGY  ED MD interpretation:  No acute cardiopulmonary process  Official radiology report(s): Dg Chest 2 View  Result Date: 08/02/2018 CLINICAL DATA:  Acute onset of tachycardia and blurred vision. Recently took new blood pressure medications. EXAM: CHEST - 2 VIEW COMPARISON:  None. FINDINGS: The lungs are well-aerated and clear. There is no evidence of focal opacification, pleural effusion or pneumothorax. The heart is normal in size; the mediastinal contour  is within normal limits. No acute osseous abnormalities are seen. IMPRESSION: No acute cardiopulmonary process seen. Electronically Signed   By: Roanna Raider M.D.   On: 08/02/2018 00:22    ____________________________________________   PROCEDURES  Procedure(s) performed: None  Procedures  Critical Care performed: No  ____________________________________________   INITIAL IMPRESSION / ASSESSMENT AND PLAN / ED COURSE  As part of my medical decision making, I reviewed the following data within the electronic MEDICAL RECORD NUMBER Nursing notes reviewed and incorporated, Labs reviewed, EKG interpreted, Old chart reviewed, Radiograph reviewed and Notes from prior ED visits   32 year old male who presents with palpitations, anxiety and blurry vision after starting Nifedipine and Buspirone. Differential diagnosis includes but is not limited to CAD, medication reaction, thyroid issues, infectious, metabolic etiologies, etc.    Clinical Course as of Aug 02 317  Sat Aug 02, 2018  0244 Heart rate normalized.  Patient feeling better after Ativan.  Updated him on all test results.  Will refer patient to cardiology for outpatient follow-up.  Advised him to just take the nifedipine this weekend instead of both new medications.  Strict return precautions given.  Patient verbalizes understanding agrees with plan of care.   [JS]    Clinical Course User Index [JS] Irean HongSung, Ndia Sampath J, MD     ____________________________________________   FINAL CLINICAL IMPRESSION(S) / ED DIAGNOSES  Final diagnoses:  Palpitations  Anxiety     ED Discharge Orders    None       Note:  This document was prepared using Dragon voice recognition software and may include unintentional dictation errors.    Irean HongSung, Atiya Yera J, MD 08/02/18 [phone]

## 2018-08-02 NOTE — ED Notes (Signed)
ED Provider at bedside. 

## 2018-08-02 NOTE — Discharge Instructions (Addendum)
1.  Just take Nifedipine this weekend and see how it makes you feel. 2.  Decrease caffeine use daily. 3.  Drink plenty of water daily. 4.  Return to the ER for worsening symptoms, persistent vomiting, difficulty breathing or other concerns.

## 2018-08-02 NOTE — ED Notes (Signed)
Bilateral hand shaking noted. Pt appears anxious.

## 2018-09-09 ENCOUNTER — Encounter: Payer: Self-pay | Admitting: Emergency Medicine

## 2018-09-09 ENCOUNTER — Other Ambulatory Visit: Payer: Self-pay

## 2018-09-09 ENCOUNTER — Emergency Department: Payer: BLUE CROSS/BLUE SHIELD

## 2018-09-09 ENCOUNTER — Emergency Department
Admission: EM | Admit: 2018-09-09 | Discharge: 2018-09-09 | Disposition: A | Discharge status: Home / Self Care | Payer: BLUE CROSS/BLUE SHIELD | Attending: Emergency Medicine | Admitting: Emergency Medicine

## 2018-09-09 DIAGNOSIS — J9801 Acute bronchospasm: Secondary | ICD-10-CM | POA: Diagnosis not present

## 2018-09-09 DIAGNOSIS — I10 Essential (primary) hypertension: Secondary | ICD-10-CM | POA: Insufficient documentation

## 2018-09-09 DIAGNOSIS — Z79899 Other long term (current) drug therapy: Secondary | ICD-10-CM | POA: Diagnosis not present

## 2018-09-09 DIAGNOSIS — F419 Anxiety disorder, unspecified: Secondary | ICD-10-CM | POA: Insufficient documentation

## 2018-09-09 DIAGNOSIS — R002 Palpitations: Secondary | ICD-10-CM | POA: Diagnosis present

## 2018-09-09 LAB — BASIC METABOLIC PANEL
Anion gap: 9 (ref 5–15)
BUN: 12 mg/dL (ref 6–20)
CO2: 25 mmol/L (ref 22–32)
Calcium: 8.9 mg/dL (ref 8.9–10.3)
Chloride: 105 mmol/L (ref 98–111)
Creatinine, Ser: 1.15 mg/dL (ref 0.61–1.24)
GFR calc Af Amer: >60 mL/min (ref >60)
GFR calc non Af Amer: >60 mL/min (ref >60)
Glucose, Bld: 101 mg/dL — ABNORMAL HIGH (ref 70–99)
Potassium: 3.4 mmol/L — ABNORMAL LOW (ref 3.5–5.1)
Sodium: 139 mmol/L (ref 135–145)

## 2018-09-09 LAB — CBC
HCT: 42.5 % (ref 39.0–52.0)
Hemoglobin: 13.8 g/dL (ref 13.0–17.0)
MCH: 26.8 pg (ref 26.0–34.0)
MCHC: 32.5 g/dL (ref 30.0–36.0)
MCV: 82.5 fL (ref 80.0–100.0)
Platelets: 285 10*3/uL (ref 150–400)
RBC: 5.15 MIL/uL (ref 4.22–5.81)
RDW: 12.7 % (ref 11.5–15.5)
WBC: 6.7 10*3/uL (ref 4.0–10.5)
nRBC: 0 % (ref 0.0–0.2)

## 2018-09-09 LAB — TROPONIN I: Troponin I: <0.03 ng/mL (ref <0.03)

## 2018-09-09 MED ORDER — PREDNISONE 20 MG PO TABS
40.0000 mg | ORAL_TABLET | ORAL | Status: AC
  Administered 2018-09-09: 40 mg via ORAL
  Filled 2018-09-09: qty 2

## 2018-09-09 MED ORDER — PREDNISONE 20 MG PO TABS: 20.0000 mg | ORAL_TABLET | Freq: Every day | ORAL | 0 refills | Status: AC

## 2018-09-09 NOTE — ED Triage Notes (Addendum)
PT to ED with c/o palpatations and intermit  LFT arm numbness that started after duoneb treatment at home. PT states HR was 120s when he checked. PT hx of anxiety. PT denies any numbness at this time. NAD noted . Pt states he is taking suboxone at this time  through [facility] at this time

## 2018-09-09 NOTE — ED Notes (Signed)
Patient alert and oriented. Patient denies any pain. Patient states understanding of discharge paperwork. Patient signed discharge.

## 2018-09-09 NOTE — Discharge Instructions (Addendum)
Your labs and chest x-ray today were okay.  Continue using your inhaler or nebulizer treatments every 4 hours while awake today and tomorrow, and take prednisone as prescribed to help with your breathing symptoms.  Return to the ER if you start developing a thick cough, fever, or worsening breathing.  Due to the holidays, many pharmacies are currently closed until the 26th.  The [facility] at [address] [place] is [place] 24 hours.

## 2018-09-09 NOTE — ED Provider Notes (Signed)
[facility] Emergency Department Provider Note  ____________________________________________  Time seen: Approximately 6:17 PM  I have reviewed the triage vital signs and the nursing notes.   HISTORY  Chief Complaint Palpitations    HPI Kurt Collier is a 32 y.o. male with a history of anxiety depression and hypertension comes to the ED today complaining of an episode of feeling anxious and having difficulty breathing that occurred in a [employer].  He went to his car and checked his blood pressure and found that his heart rate was fast about 120.  He went home and used his nebulizer, but after waiting a period of time his symptoms had not improved as much as he expected so he comes to the ED.  He is now feeling better and asymptomatic.  He denies any chest pain at any point in all this.  He denies any recent exertional symptoms.  He denies any significant shortness of breath and feels that mostly he had anxiety and palpitations.  He has been taking all his medications as usual including hydralazine for blood pressure.  No recent travel trauma hospitalizations or surgeries.  Denies a history of DVT or PE.      Past Medical History:  Diagnosis Date  . Anxiety   . Depression   . Hypertension      Patient Active Problem List   Diagnosis Date Noted  . Opioid use disorder, mild, in early remission, on maintenance therapy (HCC) 02/14/2017  . Adjustment disorder with mixed anxiety and depressed mood 02/13/2017     History reviewed. No pertinent surgical history.   Prior to Admission medications   Medication Sig Start Date End Date Taking? Authorizing Provider  Buprenorphine HCl-Naloxone HCl 8-2 MG FILM Place 1 Film under the tongue daily. 07/25/18   [provider]  busPIRone (BUSPAR) 10 MG tablet Take 10 mg by mouth 2 (two) times daily. 07/30/18   [provider]  FLUoxetine (PROZAC) 40 MG capsule Take 40 mg by mouth daily.  07/25/18   [provider]  hydrOXYzine (ATARAX/VISTARIL) 50 MG tablet Take 50 mg by mouth at bedtime. 07/01/18   [provider]  NIFEdipine (ADALAT CC) 90 MG 24 hr tablet Take 90 mg by mouth daily. 07/30/18   [provider]  predniSONE (DELTASONE) 20 MG tablet Take 1 tablet (20 mg total) by mouth daily for 4 days. 09/09/18 09/13/18  Sharman CheekStafford, Kaidence Callaway, MD     Allergies Patient has no known allergies.   No family history on file.  Social History Social History   Tobacco Use  . Smoking status: Never Smoker  . Smokeless tobacco: Never Used  Substance Use Topics  . Alcohol use: Yes    Alcohol/week: 1.0 standard drinks    Types: 1 Glasses of wine per week  . Drug use: No    Review of Systems  Constitutional:   No fever or chills.  ENT:   No sore throat. No rhinorrhea. Cardiovascular:   No chest pain or syncope. Respiratory:   No dyspnea positive nonproductive cough. Gastrointestinal:   Negative for abdominal pain, vomiting and diarrhea.  Musculoskeletal:   Negative for focal pain or swelling All other systems reviewed and are negative except as documented above in ROS and HPI.  ____________________________________________   PHYSICAL EXAM:  VITAL SIGNS: ED Triage Vitals  Enc Vitals Group     BP 09/09/18 1541 (!) 187/100     Pulse Rate 09/09/18 1540 (!) 109     Resp 09/09/18  1538 16     Temp 09/09/18 1540 98.2 F (36.8 C)     Temp Source 09/09/18 1538 Oral     SpO2 09/09/18 1538 98 %     Weight 09/09/18 1538 234 lb (106.1 kg)     Height 09/09/18 1538 5\' 11"  (1.803 m)     Head Circumference --      Peak Flow --      Pain Score 09/09/18 1538 0     Pain Loc --      Pain Edu? --      Excl. in GC? --     Vital signs reviewed, nursing assessments reviewed.   Constitutional:   Alert and oriented. Non-toxic appearance. Eyes:   Conjunctivae are normal. EOMI. PERRL. ENT      Head:   Normocephalic and atraumatic.      Nose:   No  congestion/rhinnorhea.       Mouth/Throat:   MMM, no pharyngeal erythema. No peritonsillar mass.       Neck:   No meningismus. Full ROM. Hematological/Lymphatic/Immunilogical:   No cervical lymphadenopathy. Cardiovascular:   RRR. Symmetric bilateral radial and DP pulses.  No murmurs. Cap refill less than 2 seconds. Respiratory:   Normal respiratory effort without tachypnea/retractions.  Good air entry in all lung fields but diffuse expiratory wheezing. Gastrointestinal:   Soft and nontender. Non distended. There is no CVA tenderness.  No rebound, rigidity, or guarding. Musculoskeletal:   Normal range of motion in all extremities. No joint effusions.  No lower extremity tenderness.  No edema. Neurologic:   Normal speech and language.  Motor grossly intact. No acute focal neurologic deficits are appreciated.  Skin:    Skin is warm, dry and intact. No rash noted.  No petechiae, purpura, or bullae.  ____________________________________________    LABS (pertinent positives/negatives) (all labs ordered are listed, but only abnormal results are displayed) Labs Reviewed  BASIC METABOLIC PANEL - Abnormal; Notable for the following components:      Result Value   Potassium 3.4 (*)    Glucose, Bld 101 (*)    All other components within normal limits  CBC  TROPONIN I   ____________________________________________   EKG  Interpreted by me  Date: 09/09/2018  Rate: 91  Rhythm: normal sinus rhythm  QRS Axis: normal  Intervals: normal  ST/T Wave abnormalities: normal  Conduction Disutrbances: none  Narrative Interpretation: unremarkable      ____________________________________________    RADIOLOGY  Dg Chest 2 View  Result Date: 09/09/2018 CLINICAL DATA:  PT to ED with c/o palpatations and intermit LFT arm numbness that started after duoneb treatment at home. PT states HR was 120s when he checked. PT hx of anxietypalpatations EXAM: CHEST - 2 VIEW COMPARISON:  None. FINDINGS:  Normal mediastinum and cardiac silhouette. Normal pulmonary vasculature. No evidence of effusion, infiltrate, or pneumothorax. No acute bony abnormality. IMPRESSION: No acute cardiopulmonary process. Electronically Signed   By: Genevive Bi M.D.   On: 09/09/2018 16:04    ____________________________________________   PROCEDURES Procedures  ____________________________________________    CLINICAL IMPRESSION / ASSESSMENT AND PLAN / ED COURSE  Pertinent labs & imaging results that were available during my care of the patient were reviewed by me and considered in my medical decision making (see chart for details).    Patient presents with an episode of anxiety symptoms.  He is now asymptomatic.Considering the patient's symptoms, medical history, and physical examination today, I have low suspicion for ACS, PE, TAD, pneumothorax, carditis, mediastinitis, pneumonia,  CHF, or sepsis.  His exam does reveal wheezing which I think is due to bronchospasm.  His labs EKG and chest x-ray are all normal.  Recommend he continue his bronchodilators and I will prescribe him low-dose prednisone as well.      ____________________________________________   FINAL CLINICAL IMPRESSION(S) / ED DIAGNOSES    Final diagnoses:  Bronchospasm  Anxiety     ED Discharge Orders         Ordered    predniSONE (DELTASONE) 20 MG tablet  Daily     09/09/18 1816          Portions of this note were generated with dragon dictation software. Dictation errors may occur despite best attempts at proofreading.   Sharman CheekStafford, Teruko Joswick, MD 09/09/18 [phone]

## 2018-09-09 NOTE — ED Notes (Signed)
Pt takes hydralazine for HTN

## 2019-03-10 IMAGING — CR DG CHEST 2V
2 series · 2 of 2 positions shown · non-contrast
Comparison: None.

CLINICAL DATA: Acute onset of tachycardia and blurred vision.
Recently took new blood pressure medications.

EXAM:
CHEST - 2 VIEW

[chest pa]
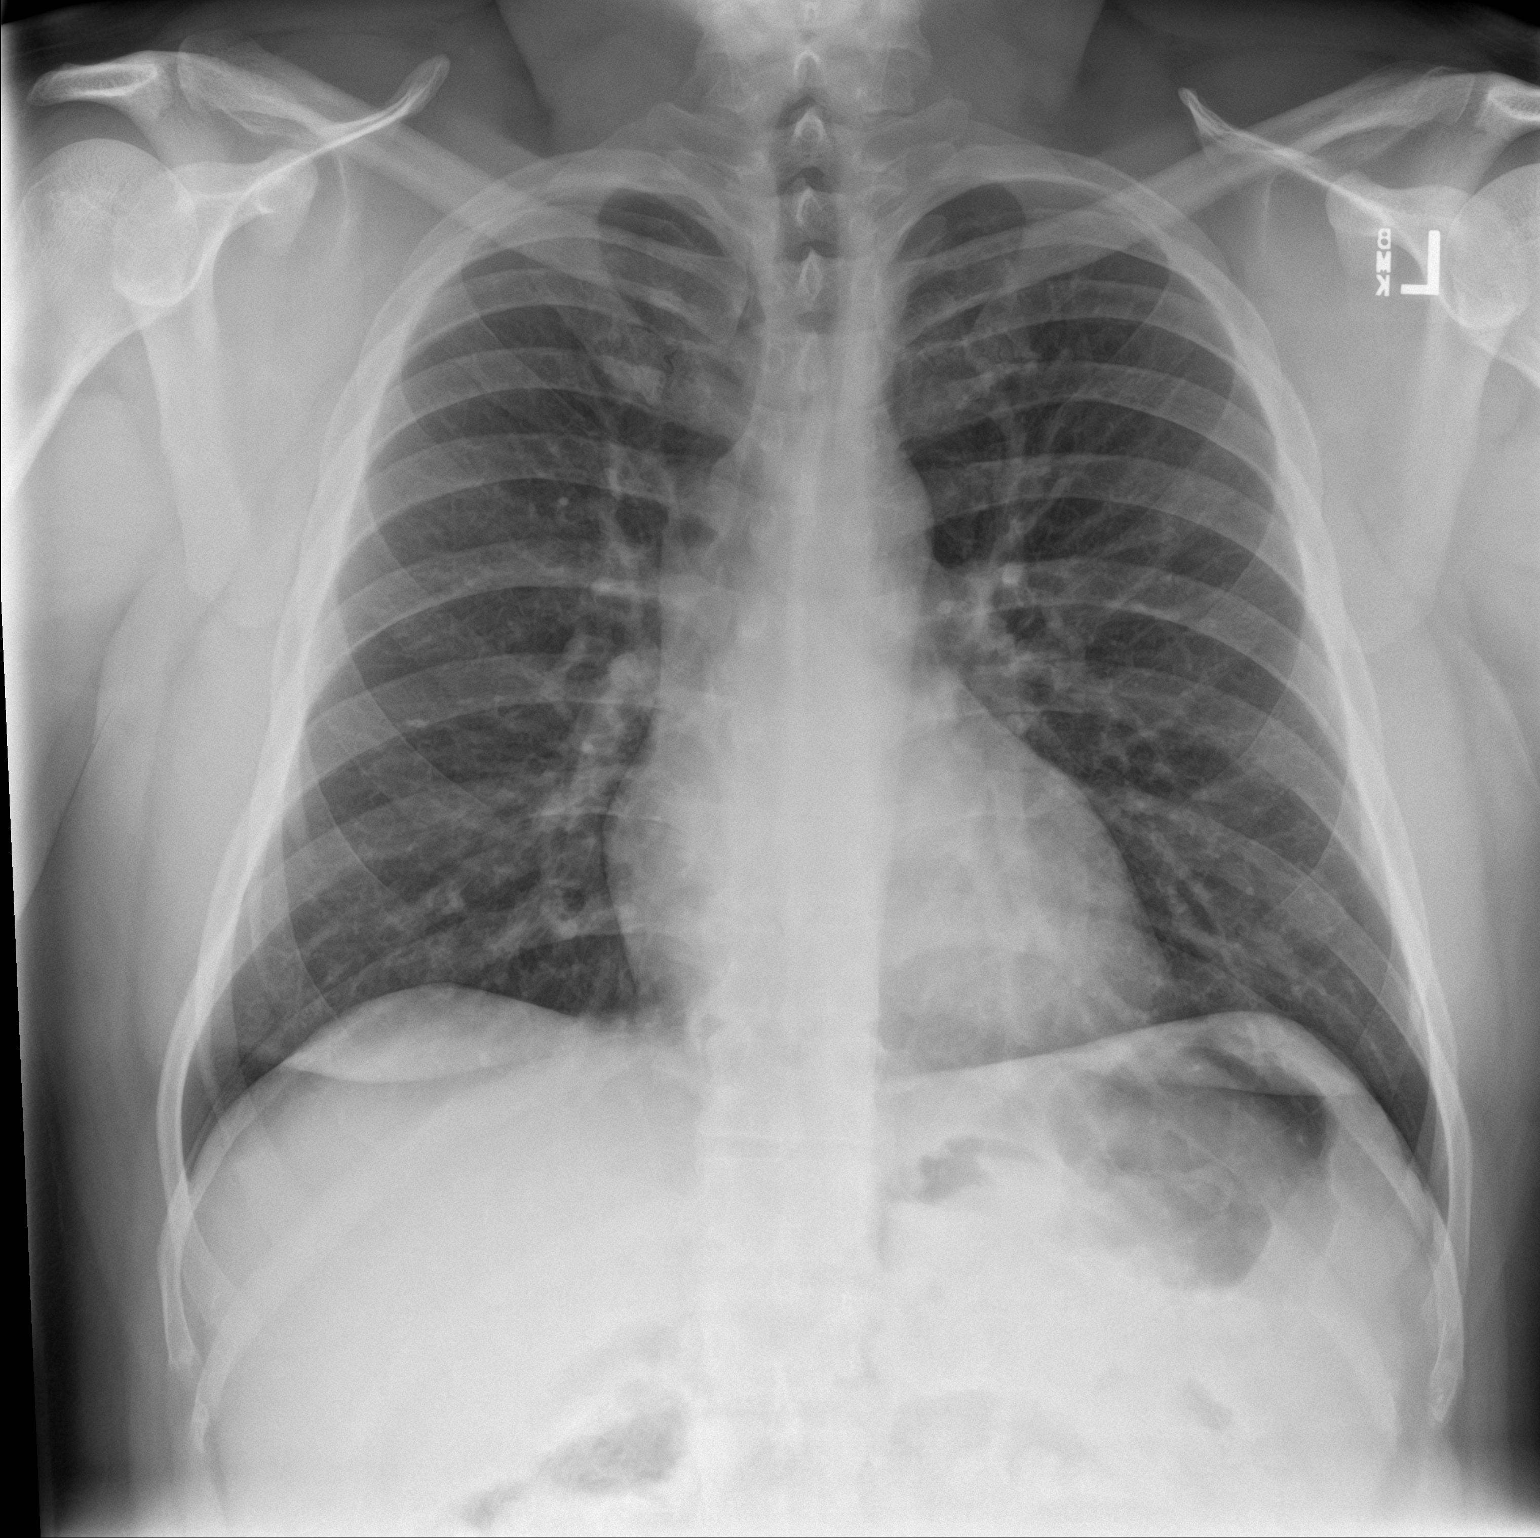

[chest lat]
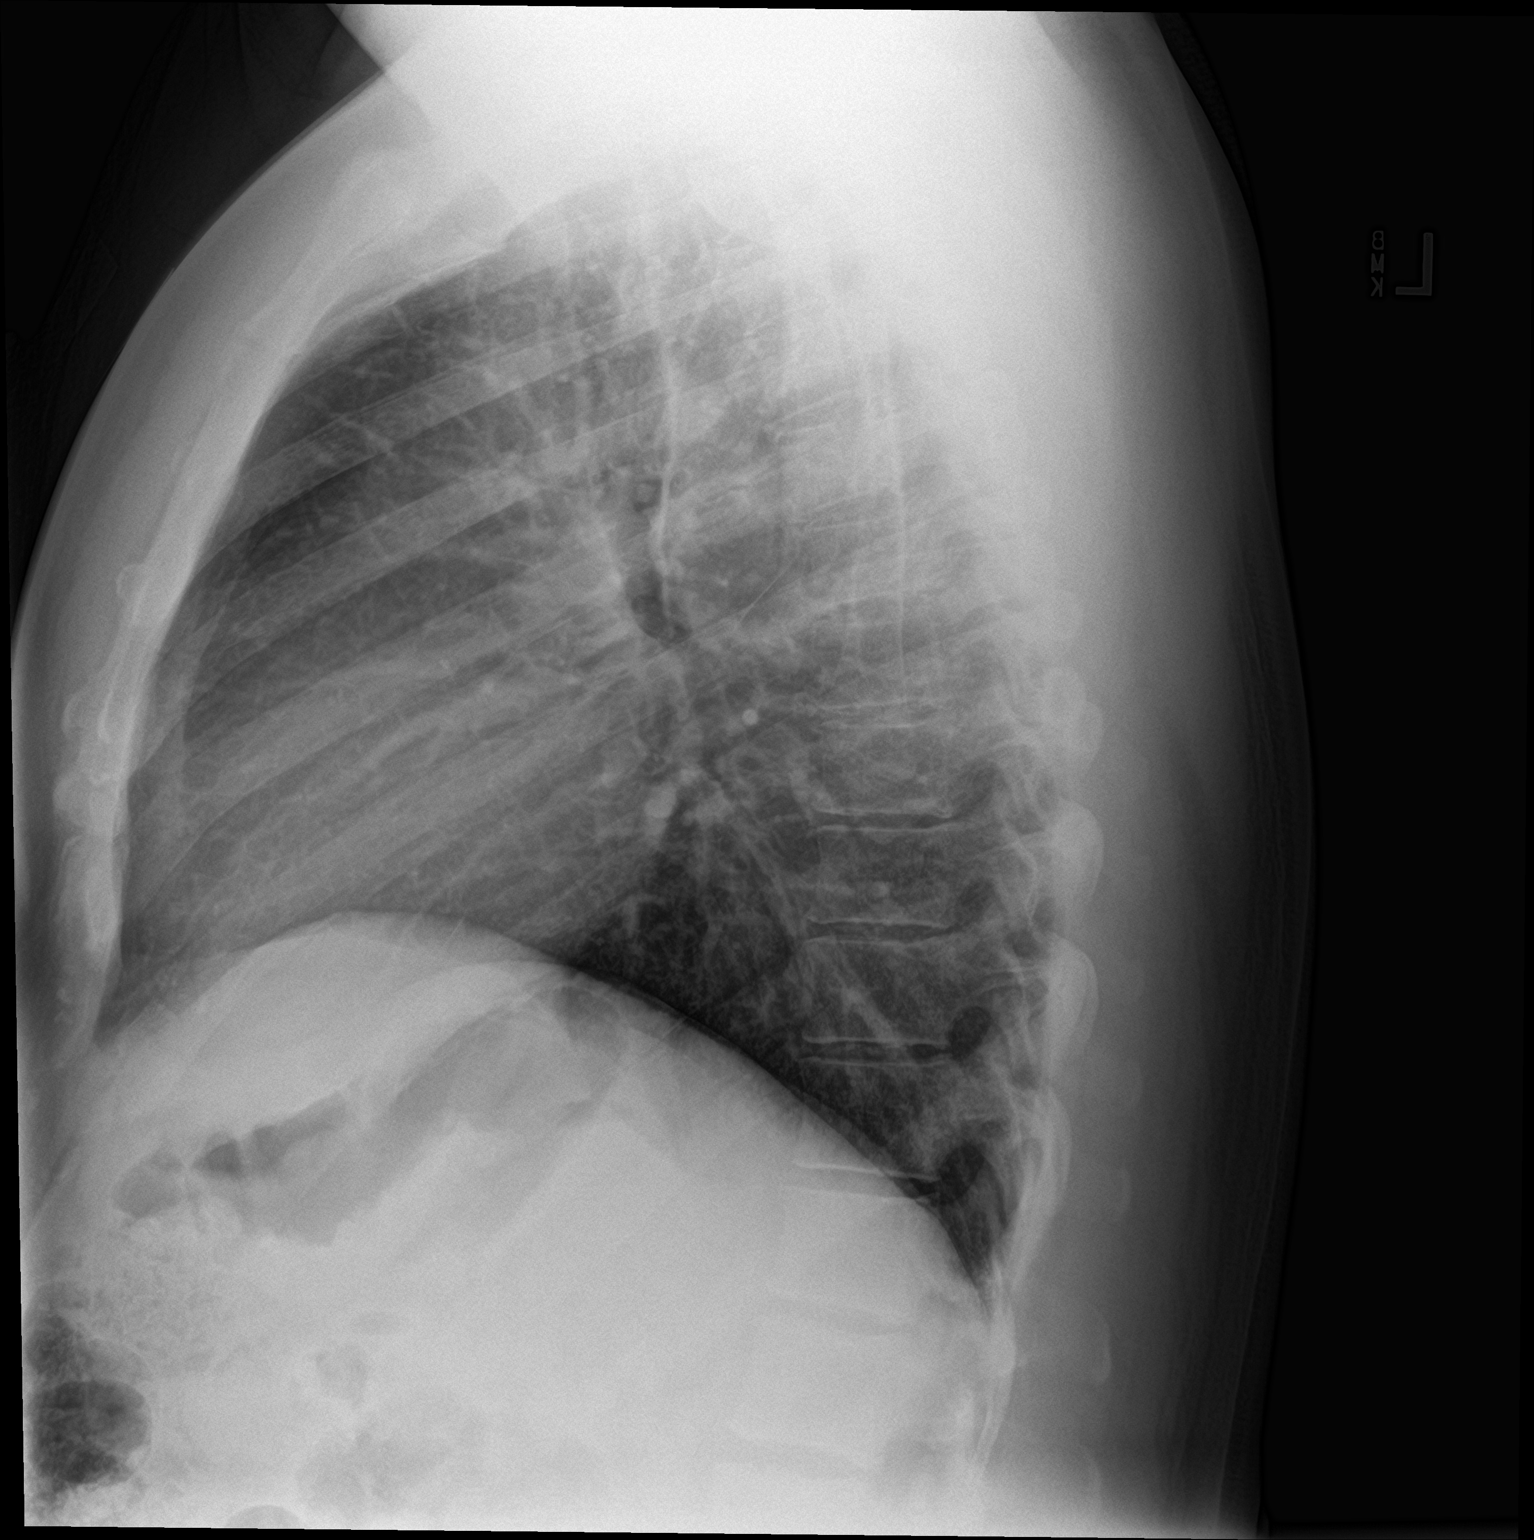

[2 of 2 positions shown; findings below may reference images not displayed]

FINDINGS: The lungs are well-aerated and clear. There is no evidence of focal
opacification, pleural effusion or pneumothorax.

The heart is normal in size; the mediastinal contour is within
normal limits. No acute osseous abnormalities are seen.
IMPRESSION: No acute cardiopulmonary process seen.

## 2019-03-20 ENCOUNTER — Emergency Department
Admission: EM | Admit: 2019-03-20 | Discharge: 2019-03-21 | Disposition: A | Discharge status: Home / Self Care | Payer: BC Managed Care – PPO | Attending: Emergency Medicine | Admitting: Emergency Medicine

## 2019-03-20 ENCOUNTER — Other Ambulatory Visit: Payer: Self-pay

## 2019-03-20 DIAGNOSIS — Z79899 Other long term (current) drug therapy: Secondary | ICD-10-CM | POA: Diagnosis not present

## 2019-03-20 DIAGNOSIS — H60332 Swimmer's ear, left ear: Secondary | ICD-10-CM

## 2019-03-20 DIAGNOSIS — I10 Essential (primary) hypertension: Secondary | ICD-10-CM | POA: Insufficient documentation

## 2019-03-20 DIAGNOSIS — H9202 Otalgia, left ear: Secondary | ICD-10-CM | POA: Diagnosis present

## 2019-03-20 LAB — CBC
HCT: 42.5 % (ref 39.0–52.0)
Hemoglobin: 13.9 g/dL (ref 13.0–17.0)
MCH: 26.9 pg (ref 26.0–34.0)
MCHC: 32.7 g/dL (ref 30.0–36.0)
MCV: 82.4 fL (ref 80.0–100.0)
Platelets: 262 10*3/uL (ref 150–400)
RBC: 5.16 MIL/uL (ref 4.22–5.81)
RDW: 12.9 % (ref 11.5–15.5)
WBC: 9.6 10*3/uL (ref 4.0–10.5)
nRBC: 0 % (ref 0.0–0.2)

## 2019-03-20 LAB — BASIC METABOLIC PANEL
Anion gap: 10 (ref 5–15)
BUN: 13 mg/dL (ref 6–20)
CO2: 27 mmol/L (ref 22–32)
Calcium: 9.4 mg/dL (ref 8.9–10.3)
Chloride: 102 mmol/L (ref 98–111)
Creatinine, Ser: 0.86 mg/dL (ref 0.61–1.24)
GFR calc Af Amer: >60 mL/min (ref >60)
GFR calc non Af Amer: >60 mL/min (ref >60)
Glucose, Bld: 123 mg/dL — ABNORMAL HIGH (ref 70–99)
Potassium: 3.4 mmol/L — ABNORMAL LOW (ref 3.5–5.1)
Sodium: 139 mmol/L (ref 135–145)

## 2019-03-20 MED ORDER — CIPROFLOXACIN-DEXAMETHASONE 0.3-0.1 % OT SUSP
4.0000 [drp] | Freq: Once | OTIC | Status: AC
  Administered 2019-03-20: 4 [drp] via OTIC
  Filled 2019-03-20: qty 7.5

## 2019-03-20 MED ORDER — CIPRODEX 0.3-0.1 % OT SUSP: 4.0000 [drp] | Freq: Two times a day (BID) | OTIC | 0 refills | Status: AC

## 2019-03-20 MED ORDER — ACETAMINOPHEN 500 MG PO TABS
1000.0000 mg | ORAL_TABLET | Freq: Once | ORAL | Status: AC
  Administered 2019-03-20: 1000 mg via ORAL
  Filled 2019-03-20: qty 2

## 2019-03-20 NOTE — Discharge Instructions (Signed)
As we discussed, keep a blood pressure diary and check your blood pressure at least 3 times a day over the weekend.  Follow-up with your cardiologist on Monday for reevaluation.  If your blood pressure is significantly elevated > 200 or if you have chest pain, dizziness, severe headache, shortness of breath please return to the emergency room.  Try to take her hydralazine every 12 hours.  For your ear infection, avoid submerging your head in the pool, hot tub, or ocean until infection is resolved.  Apply topical antibiotics 4 times a day as prescribed.  Follow-up with your primary care doctor Monday for reevaluation.  Return to the emergency room if the pain is getting worse or if you have a fever.

## 2019-03-20 NOTE — ED Provider Notes (Signed)
[facility] Emergency Department Provider Note  ____________________________________________  Time seen: Approximately 11:43 PM  I have reviewed the triage vital signs and the nursing notes.   HISTORY  Chief Complaint Hypertension   HPI Kurt Collier is a 33 y.o. male with a history of hypertension, depression, anxiety who presents for evaluation of left ear pain.  Patient reports that he was at the beach this past week and started having ear pain a few days ago.  He tried washing his ear home and that did not improve.  He reports that the pain is now involving the entire left side of his face, sharp and throbbing, constant and nonradiating.  Patient reports that while he was at the beach last week he started having episodes of dizziness.  He checked his blood pressure which was elevated with systolics up to the 573U.  He takes hydralazine for his blood pressure and endorses compliance with it.  He reports that his episodes of dizziness have resolved since returning home.  He denies any chest pain, severe thunderclap headache, slurred speech, facial droop, unilateral weakness or numbness, or gait abnormalities. Past Medical History:  Diagnosis Date   Anxiety    Depression    Hypertension     Patient Active Problem List   Diagnosis Date Noted   Opioid use disorder, mild, in early remission, on maintenance therapy ([facility]) 02/14/2017   Adjustment disorder with mixed anxiety and depressed mood 02/13/2017    No past surgical history on file.  Prior to Admission medications   Medication Sig Start Date End Date Taking? Authorizing Provider  Buprenorphine HCl-Naloxone HCl 8-2 MG FILM Place 1 Film under the tongue daily. 07/25/18   [provider]  busPIRone (BUSPAR) 10 MG tablet Take 10 mg by mouth 2 (two) times daily. 07/30/18   [provider]  ciprofloxacin-dexamethasone (CIPRODEX) OTIC suspension Place 4 drops into the left ear 2 (two)  times daily for 7 days. 03/20/19 03/27/19  Rudene Re, MD  FLUoxetine (PROZAC) 40 MG capsule Take 40 mg by mouth daily. 07/25/18   [provider]  hydrOXYzine (ATARAX/VISTARIL) 50 MG tablet Take 50 mg by mouth at bedtime. 07/01/18   [provider]  NIFEdipine (ADALAT CC) 90 MG 24 hr tablet Take 90 mg by mouth daily. 07/30/18   [provider]    Allergies Patient has no known allergies.  No family history on file.  Social History Social History   Tobacco Use   Smoking status: Never Smoker   Smokeless tobacco: Never Used  Substance Use Topics   Alcohol use: Yes    Alcohol/week: 1.0 standard drinks    Types: 1 Glasses of wine per week   Drug use: No    Review of Systems  Constitutional: Negative for fever. + dizziness Eyes: Negative for visual changes. ENT: Negative for sore throat. + L ear pain Neck: No neck pain  Cardiovascular: Negative for chest pain. Respiratory: Negative for shortness of breath. Gastrointestinal: Negative for abdominal pain, vomiting or diarrhea. Genitourinary: Negative for dysuria. Musculoskeletal: Negative for back pain. Skin: Negative for rash. Neurological: Negative for weakness or numbness. + HA Psych: No SI or HI  ____________________________________________   PHYSICAL EXAM:  VITAL SIGNS: ED Triage Vitals  Enc Vitals Group     BP 03/20/19 2147 (!) 168/95     Pulse Rate 03/20/19 2147 (!) 114     Resp 03/20/19 2147 18     Temp 03/20/19 2147 98.8 F (37.1 C)  Temp Source 03/20/19 2147 Oral     SpO2 03/20/19 2147 97 %     Weight 03/20/19 2146 265 lb (120.2 kg)     Height 03/20/19 2146 5\' 9"  (1.753 m)     Head Circumference --      Peak Flow --      Pain Score 03/20/19 2146 7     Pain Loc --      Pain Edu? --      Excl. in GC? --     Constitutional: Alert and oriented. Well appearing and in no apparent distress. HEENT:      Head: Normocephalic and atraumatic.         Eyes: Conjunctivae are  normal. Sclera is non-icteric.       Ear: L TM is visualized and clear, patient has erythema of the external ear canal with mild clear discharge      Mouth/Throat: Mucous membranes are moist.       Neck: Supple with no signs of meningismus. Cardiovascular: Regular rate and rhythm. No murmurs, gallops, or rubs. 2+ symmetrical distal pulses are present in all extremities. No JVD. Respiratory: Normal respiratory effort. Lungs are clear to auscultation bilaterally. No wheezes, crackles, or rhonchi.  Gastrointestinal: Soft, non tender, and non distended with positive bowel sounds. No rebound or guarding. Musculoskeletal: Nontender with normal range of motion in all extremities. No edema, cyanosis, or erythema of extremities. Neurologic: Normal speech and language. Face is symmetric. Moving all extremities. No gross focal neurologic deficits are appreciated. Skin: Skin is warm, dry and intact. No rash noted. Psychiatric: Mood and affect are normal. Speech and behavior are normal.  ____________________________________________   LABS (all labs ordered are listed, but only abnormal results are displayed)  Labs Reviewed  BASIC METABOLIC PANEL - Abnormal; Notable for the following components:      Result Value   Potassium 3.4 (*)    Glucose, Bld 123 (*)    All other components within normal limits  CBC  URINALYSIS, COMPLETE (UACMP) WITH MICROSCOPIC   ____________________________________________  EKG  ED ECG REPORT I, Nita Sicklearolina Kadience Macchi, the attending physician, personally viewed and interpreted this ECG.  Sinus tachycardia, rate of 108, normal intervals, normal axis, no ST elevations or depressions.  Otherwise normal EKG and unchanged from prior. ____________________________________________  RADIOLOGY  none  ____________________________________________   PROCEDURES  Procedure(s) performed: None Procedures Critical Care performed:   None ____________________________________________   INITIAL IMPRESSION / ASSESSMENT AND PLAN / ED COURSE  33 y.o. male with a history of hypertension, depression, anxiety who presents for evaluation of left ear pain.   # ear pain: Exam is consistent with otitis externa especially due to recent trip to the beach.  Will treat with topical Cipro.  Recommended close follow-up with PCP on Monday for reevaluation.  # HTN: Patient reporting hypertension over the last week while on vacation.  At this time his blood pressure is 129/79.  His labs showed no endorgan damage.  EKG shows no ischemic changes.  Recommended patient trying to time his hydralazine every 12 hours to prevent rebound hypertension and keeping a diary of blood pressure 3 times a day over the weekend so he can follow-up with his cardiologist on Monday to see if he needs any adjustments on his medications.      As part of my medical decision making, I reviewed the following data within the electronic MEDICAL RECORD NUMBER Nursing notes reviewed and incorporated, Labs reviewed , EKG interpreted , Old EKG reviewed, Old  chart reviewed, Notes from prior ED visits and [place] Controlled Substance Database    Pertinent labs & imaging results that were available during my care of the patient were reviewed by me and considered in my medical decision making (see chart for details).    ____________________________________________   FINAL CLINICAL IMPRESSION(S) / ED DIAGNOSES  Final diagnoses:  Essential hypertension  Acute swimmer's ear of left side      NEW MEDICATIONS STARTED DURING THIS VISIT:  ED Discharge Orders         Ordered    ciprofloxacin-dexamethasone (CIPRODEX) OTIC suspension  2 times daily     03/20/19 2354           Note:  This document was prepared using Dragon voice recognition software and may include unintentional dictation errors.    Don PerkingVeronese, [place], MD 03/20/19 [phone]

## 2019-03-20 NOTE — ED Triage Notes (Signed)
Patient reports ? Ear infection to left ear with decrease ability to hear.  Also reports feels like his blood pressure is up, has been having headaches and dizziness.

## 2019-04-17 IMAGING — CR DG CHEST 2V
1 series · 2 of 2 positions shown · non-contrast
Comparison: None.

CLINICAL DATA: PT to ED with c/o palpatations and intermit LFT arm
numbness that started after duoneb treatment at home. PT states HR
was 120s when he checked. PT hx of anxietypalpatations

EXAM:
CHEST - 2 VIEW

[Series 1: w chest pa · 0.14mm/px · 2 of 2 slices shown]
[im 1/2]
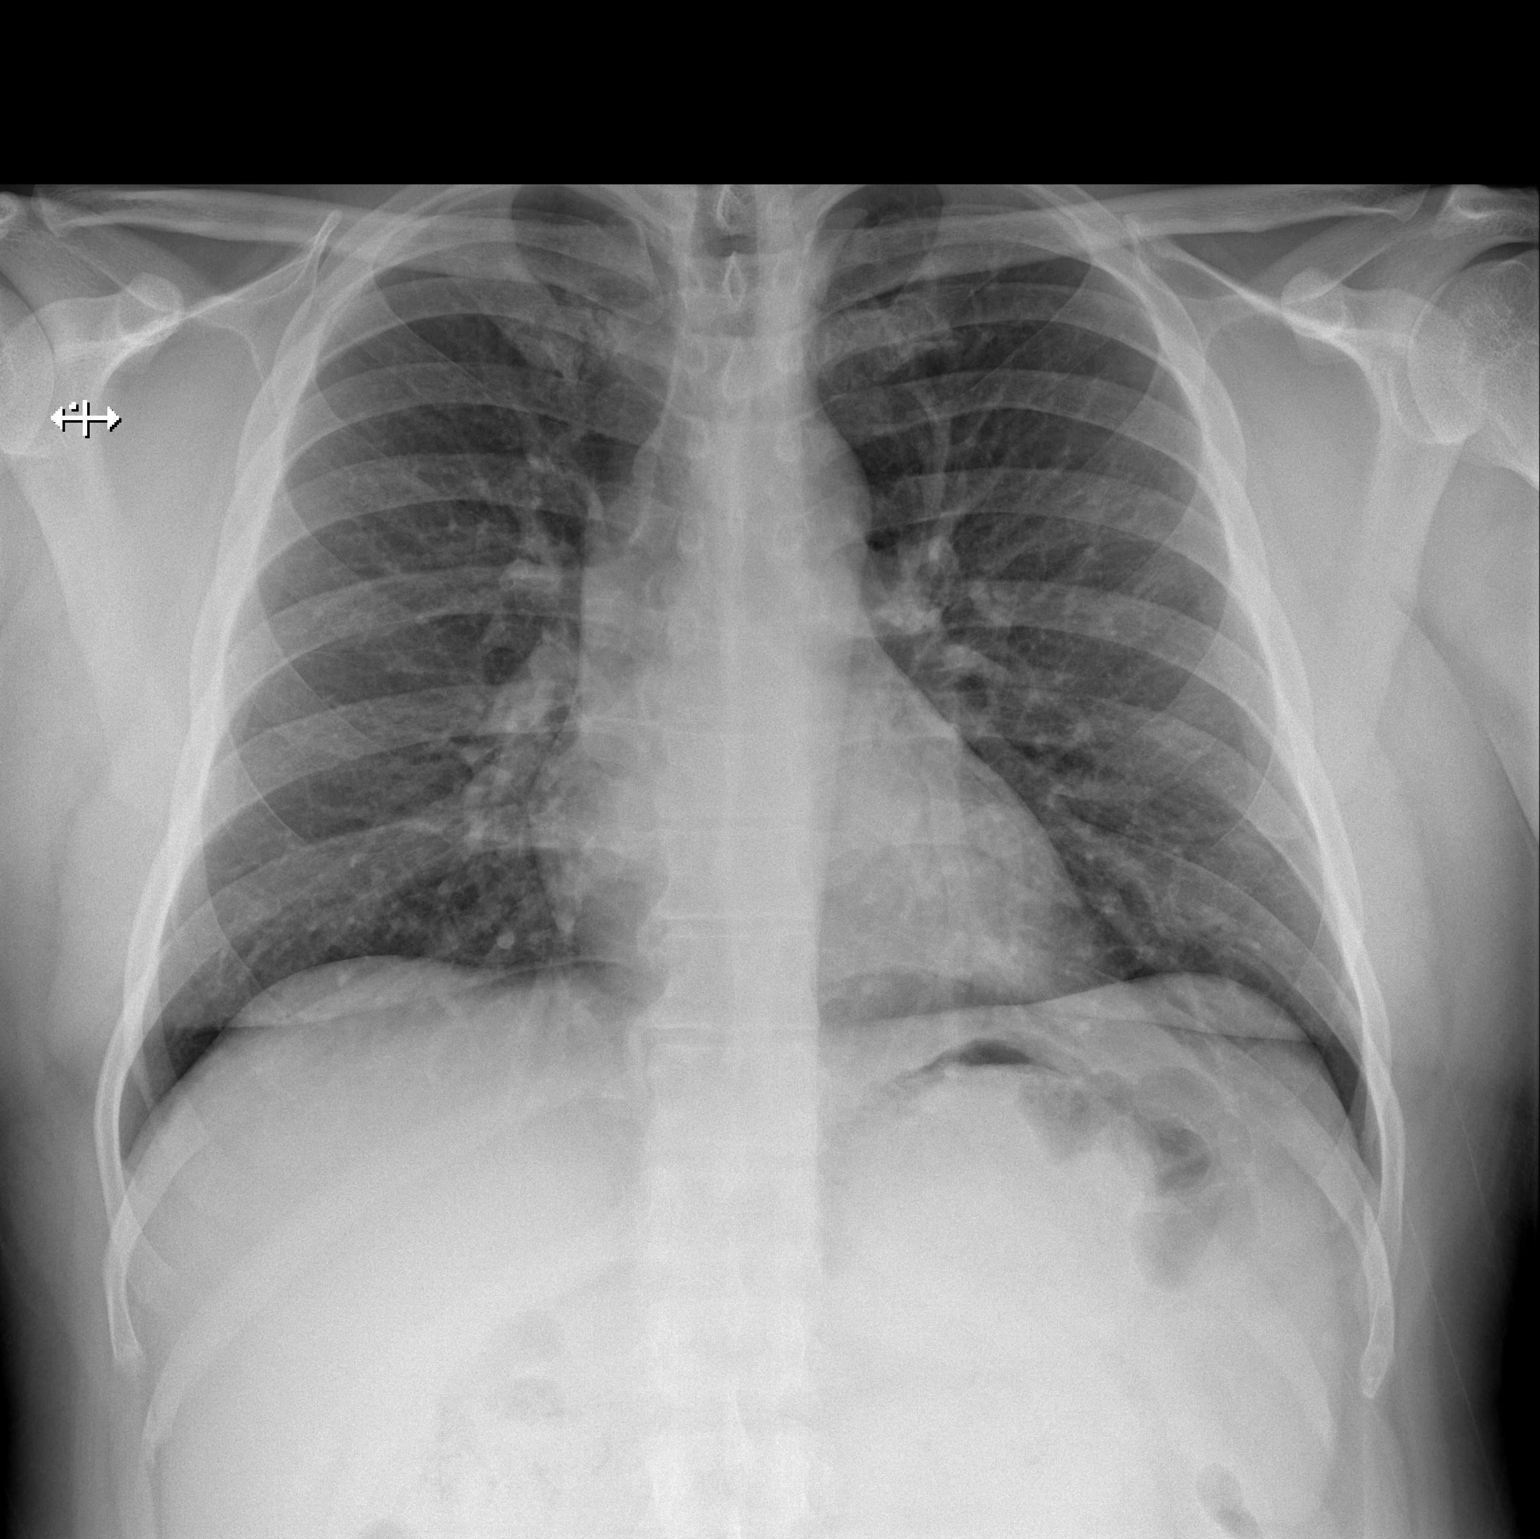
[im 2/2]
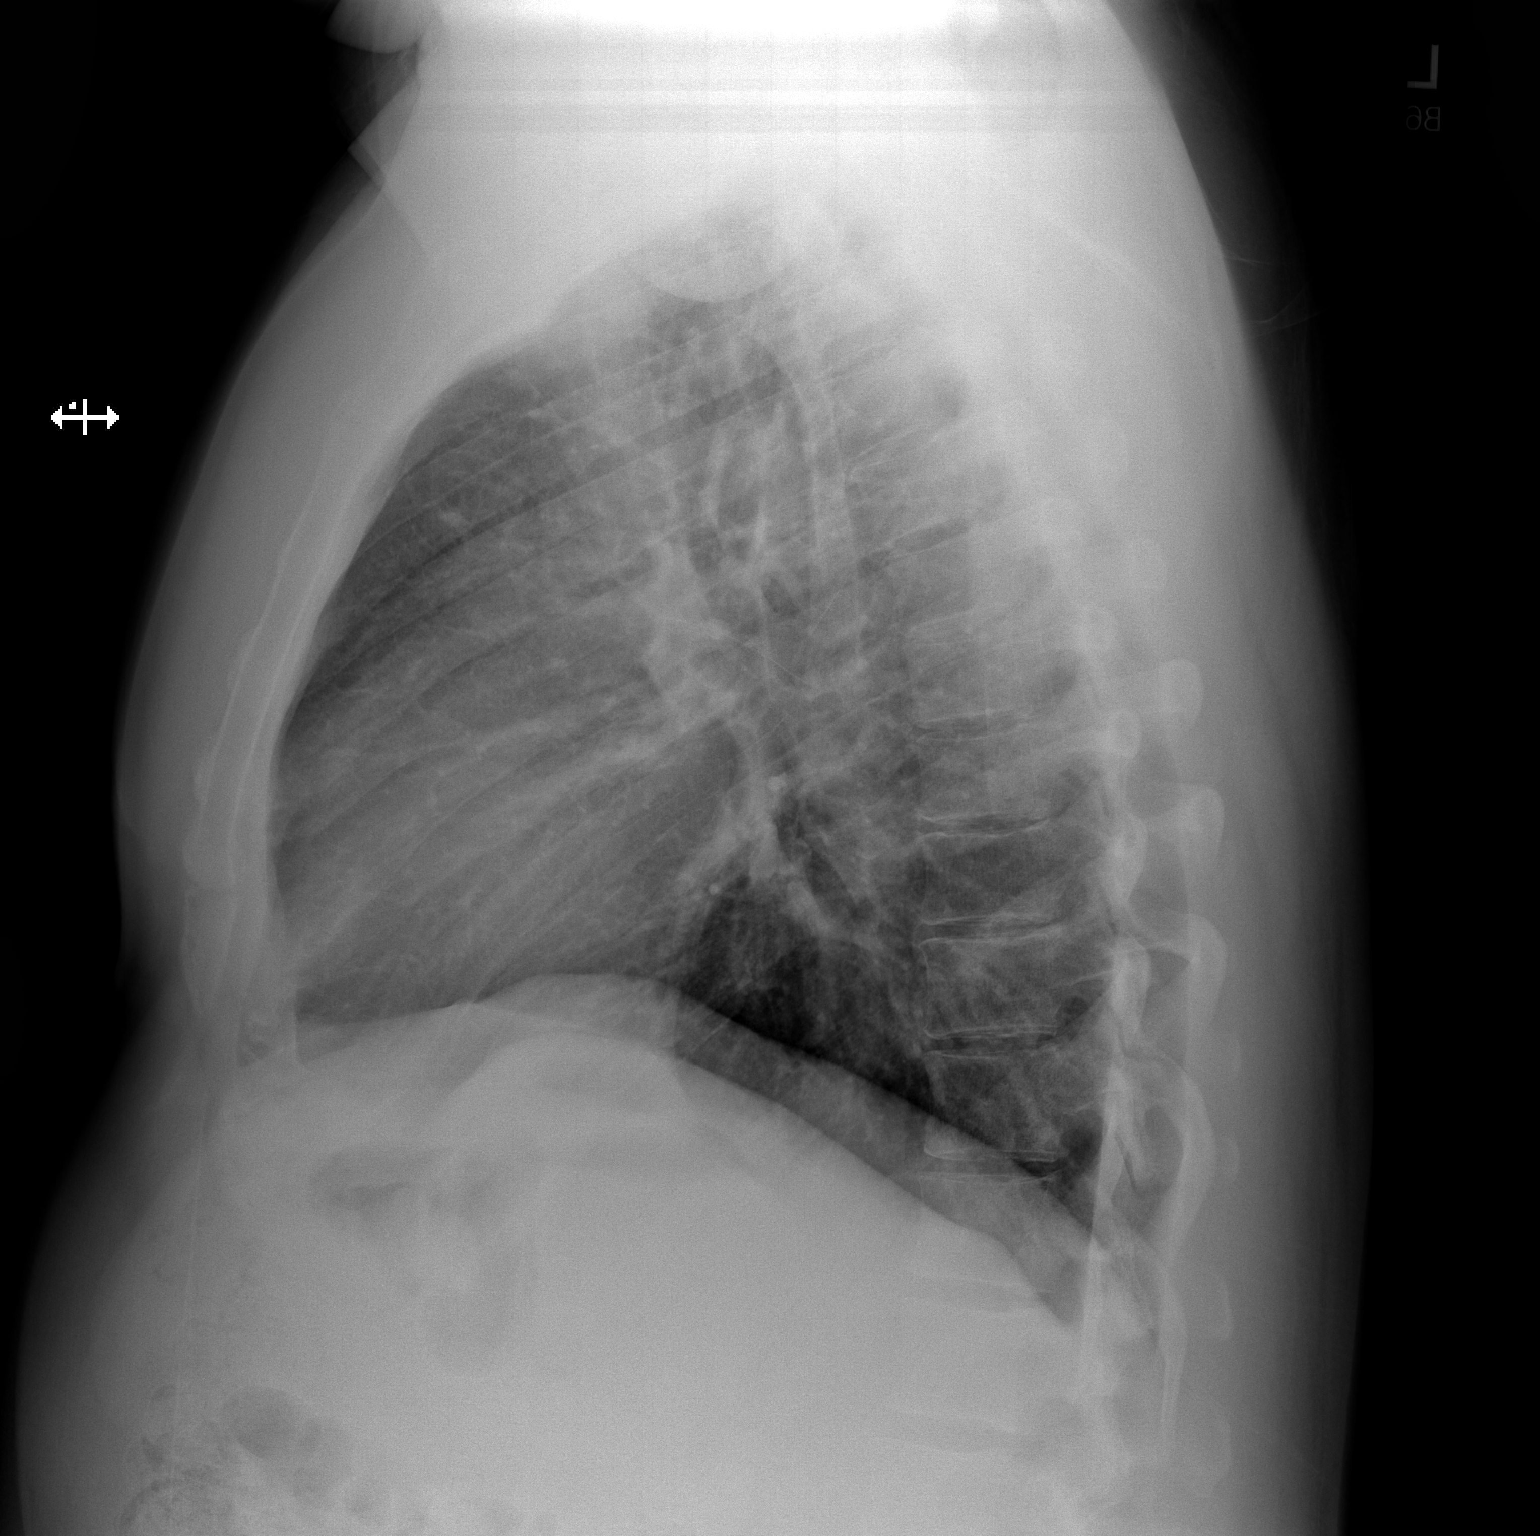

[2 of 2 positions shown; findings below may reference images not displayed]

FINDINGS: Normal mediastinum and cardiac silhouette. Normal pulmonary
vasculature. No evidence of effusion, infiltrate, or pneumothorax.
No acute bony abnormality.
IMPRESSION: No acute cardiopulmonary process.

## 2020-07-20 ENCOUNTER — Emergency Department
Admission: EM | Admit: 2020-07-20 | Discharge: 2020-07-20 | Disposition: A | Discharge status: Home / Self Care | Payer: BC Managed Care – PPO | Attending: Emergency Medicine | Admitting: Emergency Medicine

## 2020-07-20 ENCOUNTER — Other Ambulatory Visit: Payer: Self-pay

## 2020-07-20 DIAGNOSIS — F419 Anxiety disorder, unspecified: Secondary | ICD-10-CM | POA: Insufficient documentation

## 2020-07-20 DIAGNOSIS — Z79899 Other long term (current) drug therapy: Secondary | ICD-10-CM | POA: Insufficient documentation

## 2020-07-20 DIAGNOSIS — I10 Essential (primary) hypertension: Secondary | ICD-10-CM | POA: Diagnosis not present

## 2020-07-20 DIAGNOSIS — R42 Dizziness and giddiness: Secondary | ICD-10-CM | POA: Insufficient documentation

## 2020-07-20 LAB — CBC
HCT: 39.2 % (ref 39.0–52.0)
Hemoglobin: 13 g/dL (ref 13.0–17.0)
MCH: 27.5 pg (ref 26.0–34.0)
MCHC: 33.2 g/dL (ref 30.0–36.0)
MCV: 82.9 fL (ref 80.0–100.0)
Platelets: 281 10*3/uL (ref 150–400)
RBC: 4.73 MIL/uL (ref 4.22–5.81)
RDW: 12.8 % (ref 11.5–15.5)
WBC: 6 10*3/uL (ref 4.0–10.5)
nRBC: 0 % (ref 0.0–0.2)

## 2020-07-20 LAB — BASIC METABOLIC PANEL
Anion gap: 11 (ref 5–15)
BUN: 15 mg/dL (ref 6–20)
CO2: 27 mmol/L (ref 22–32)
Calcium: 9.2 mg/dL (ref 8.9–10.3)
Chloride: 102 mmol/L (ref 98–111)
Creatinine, Ser: 0.88 mg/dL (ref 0.61–1.24)
GFR, Estimated: >60 mL/min (ref >60)
Glucose, Bld: 115 mg/dL — ABNORMAL HIGH (ref 70–99)
Potassium: 3.7 mmol/L (ref 3.5–5.1)
Sodium: 140 mmol/L (ref 135–145)

## 2020-07-20 MED ORDER — DROPERIDOL 2.5 MG/ML IJ SOLN
2.5000 mg | Freq: Once | INTRAMUSCULAR | Status: AC
  Administered 2020-07-20: 2.5 mg via INTRAMUSCULAR
  Filled 2020-07-20: qty 2

## 2020-07-20 MED ORDER — KETOROLAC TROMETHAMINE 30 MG/ML IJ SOLN
30.0000 mg | Freq: Once | INTRAMUSCULAR | Status: AC
  Administered 2020-07-20: 30 mg via INTRAMUSCULAR
  Filled 2020-07-20: qty 1

## 2020-07-20 MED ORDER — HYDROXYZINE HCL 25 MG PO TABS
25.0000 mg | ORAL_TABLET | Freq: Once | ORAL | Status: AC
  Administered 2020-07-20: 25 mg via ORAL
  Filled 2020-07-20: qty 1

## 2020-07-20 MED ORDER — ACETAMINOPHEN 500 MG PO TABS
1000.0000 mg | ORAL_TABLET | Freq: Once | ORAL | Status: AC
  Administered 2020-07-20: 1000 mg via ORAL
  Filled 2020-07-20: qty 2

## 2020-07-20 NOTE — Discharge Instructions (Signed)
Please follow-up with Dr. Malvin Johns, your neurologist.  I would recommend you continue all of your medications at home, including her Fioricet for headaches  Return to the ED with any fevers or passing out with your symptoms.

## 2020-07-20 NOTE — ED Provider Notes (Signed)
[facility] Emergency Department Provider Note ____________________________________________   First MD Initiated Contact with Patient 07/20/20 1657     (approximate)  I have reviewed the triage vital signs and the nursing notes.  HISTORY  Chief Complaint Dizziness   HPI Kurt Collier is a 34 y.o. malewho presents to the ED for evaluation of dizziness.  Chart review indicates history of HTN, depression and anxiety. Patient follows with [facility] clinic neurologist, Dr. Malvin Johns, due to anxiety, dizziness and headaches.  Patient prescribed nortriptyline, Effexor and Depakote for this.  Patient had brain MRI 5 months ago through his neurologist, and this was normal.    Patient presents to the ED with 5 days of dizziness, headache, diffuse myalgias.  Patient reports these are his typical symptoms for which he sees his neurologist, he reports they often do not last for 5 days in a row and this is concerning to him.    He denies any falls, syncope, trauma, injuries, fevers, chest pain, shortness of breath, cough, abdominal pain, emesis or diarrhea.  He reports tolerating p.o. intake and toileting at his baseline.  He is ambulatory without difficulty.  Past Medical History:  Diagnosis Date  . Anxiety   . Depression   . Hypertension     Patient Active Problem List   Diagnosis Date Noted  . Opioid use disorder, mild, in early remission, on maintenance therapy ([facility]) 02/14/2017  . Adjustment disorder with mixed anxiety and depressed mood 02/13/2017    History reviewed. No pertinent surgical history.  Prior to Admission medications   Medication Sig Start Date End Date Taking? Authorizing Provider  Buprenorphine HCl-Naloxone HCl 8-2 MG FILM Place 1 Film under the tongue daily. 07/25/18   [provider]  busPIRone (BUSPAR) 10 MG tablet Take 10 mg by mouth 2 (two) times daily. 07/30/18   [provider]  FLUoxetine (PROZAC) 40 MG capsule Take 40 mg  by mouth daily. 07/25/18   [provider]  hydrOXYzine (ATARAX/VISTARIL) 50 MG tablet Take 50 mg by mouth at bedtime. 07/01/18   [provider]  NIFEdipine (ADALAT CC) 90 MG 24 hr tablet Take 90 mg by mouth daily. 07/30/18   [provider]    Allergies Patient has no known allergies.  No family history on file.  Social History Social History   Tobacco Use  . Smoking status: Never Smoker  . Smokeless tobacco: Never Used  Substance Use Topics  . Alcohol use: Yes    Alcohol/week: 1.0 standard drink    Types: 1 Glasses of wine per week  . Drug use: No    Review of Systems  Constitutional: No fever/chills Eyes: No visual changes. ENT: No sore throat. Cardiovascular: Denies chest pain. Respiratory: Denies shortness of breath. Gastrointestinal: No abdominal pain.  No nausea, no vomiting.  No diarrhea.  No constipation. Genitourinary: Negative for dysuria. Musculoskeletal: Negative for back pain. Skin: Negative for rash. Neurological: Negative for focal weakness or numbness.  Positive for dizziness and headache Psychiatric: Positive for anxiety, negative for suicidality, homicidality and hallucinations.  ____________________________________________   PHYSICAL EXAM:  VITAL SIGNS: Vitals:   07/20/20 1427 07/20/20 1827  BP: (!) 133/57 135/85  Pulse: 79 75  Resp: 20 19  Temp: 98.2 F (36.8 C) 98 F (36.7 C)  SpO2: 96% 99%     Constitutional: Alert and oriented.  Quite anxious and fidgety, but conversational full sentences with linear thought processes. Eyes: Conjunctivae are normal. PERRL. EOMI. Head: Atraumatic. Nose: No congestion/rhinnorhea. Mouth/Throat: Mucous  membranes are moist.  Oropharynx non-erythematous. Neck: No stridor. No cervical spine tenderness to palpation. Cardiovascular: Normal rate, regular rhythm. Grossly normal heart sounds.  Good peripheral circulation. Respiratory: Normal respiratory effort.  No retractions. Lungs  CTAB. Gastrointestinal: Soft , nondistended, nontender to palpation. No abdominal bruits. No CVA tenderness. Musculoskeletal: No lower extremity tenderness nor edema.  No joint effusions. No signs of acute trauma. Neurologic:  Normal speech and language. No gross focal neurologic deficits are appreciated. No gait instability noted. Cranial nerves II through XII intact 5/5 strength and sensation in all 4 extremities Ambulatory with a normal gait independently Skin:  Skin is warm, dry and intact. No rash noted. Psychiatric: Anxious. Speech and behavior are normal.  ____________________________________________   LABS (all labs ordered are listed, but only abnormal results are displayed)  Labs Reviewed  BASIC METABOLIC PANEL - Abnormal; Notable for the following components:      Result Value   Glucose, Bld 115 (*)    All other components within normal limits  CBC  URINALYSIS, COMPLETE (UACMP) WITH MICROSCOPIC   ____________________________________________  12 Lead EKG  Sinus rhythm, rate of 75 bpm.  Normal axis and intervals.  No evidence of acute ischemia. ____________________________________________   PROCEDURES and INTERVENTIONS  Procedure(s) performed (including Critical Care):  Procedures  Medications  ketorolac (TORADOL) 30 MG/ML injection 30 mg (30 mg Intramuscular Given 07/20/20 1725)  droperidol (INAPSINE) 2.5 MG/ML injection 2.5 mg (2.5 mg Intramuscular Given 07/20/20 1724)  acetaminophen (TYLENOL) tablet 1,000 mg (1,000 mg Oral Given 07/20/20 1726)  hydrOXYzine (ATARAX/VISTARIL) tablet 25 mg (25 mg Oral Given 07/20/20 1726)    ____________________________________________   MDM / ED COURSE  34 year old male with severe anxiety and who follows with neurology for chronic dizziness, presents with acute on chronic symptoms without evidence of acute pathology, amenable to outpatient management with neurology follow-up.  Normal vital signs on room air.  Exam demonstrates  a very anxious patient who is fidgety, but has linear thought processes and no evidence of emergent psychiatric condition.  He has no evidence of neurovascular deficits, trauma or distress.  He is ambulatory with a normal gait and looks well to me, besides his anxiety.  His blood work is unremarkable.  Patient reports resolving symptoms after droperidol, Toradol and hydroxyzine.  Patient had a normal MRI of his brain a few months ago and his symptoms are similar to them, without evidence of new symptoms or pathology to warrant additional advanced imaging.  We discussed following up with his neurologist, Dr. Malvin Johns, and we discussed return precautions for the ED.  Patient medically stable for discharge home.  Clinical Course as of Jul 21 1827  Wed Jul 20, 2020  1821 Reassessed.  Patient reports improving symptoms.  We discussed following up with his neurologist and we discussed return precautions for the ED.     [DS]    Clinical Course User Index [DS] Delton Prairie, MD     ____________________________________________   FINAL CLINICAL IMPRESSION(S) / ED DIAGNOSES  Final diagnoses:  Dizziness  Anxiety     ED Discharge Orders    None       Jayro Mcmath Katrinka Blazing   Note:  This document was prepared using Dragon voice recognition software and may include unintentional dictation errors.   Delton Prairie, MD 07/20/20 [phone]

## 2020-07-20 NOTE — ED Triage Notes (Signed)
Pt to ED POV c/o dizziness, body aches and headache x5 days with back pain.  Denies known COVID exposure, fever, SHOB or chest pain  Pt in NAD at this time, RR Even and unlabored. Ambulatory to triage room   Pt reports seeing neurologist for dizziness/headache

## 2023-02-25 ENCOUNTER — Other Ambulatory Visit: Payer: Self-pay

## 2023-02-26 NOTE — Telephone Encounter (Signed)
Needs appt

## 2023-03-11 ENCOUNTER — Telehealth: Payer: Self-pay

## 2023-03-11 NOTE — Telephone Encounter (Signed)
Patient Kurt Collier asking for call back about medication and getting referral to new pcp in [place], tried to call patient back unable to Kurt Collier VM not setup
